# Patient Record
Sex: Female | Born: 1965 | Race: White | Hispanic: No | Marital: Married | Smoking: Never smoker
Health system: Southern US, Community
[De-identification: ages and names within clinical notes are randomized; demographics above are authoritative.]

## PROBLEM LIST (undated history)

## (undated) DIAGNOSIS — L739 Follicular disorder, unspecified: Secondary | ICD-10-CM

## (undated) DIAGNOSIS — R0989 Other specified symptoms and signs involving the circulatory and respiratory systems: Secondary | ICD-10-CM

## (undated) HISTORY — DX: Follicular disorder, unspecified: L73.9

## (undated) HISTORY — PX: ABDOMINAL HYSTERECTOMY: SHX81

## (undated) HISTORY — DX: Other specified symptoms and signs involving the circulatory and respiratory systems: R09.89

---

## 1999-10-19 ENCOUNTER — Emergency Department (HOSPITAL_COMMUNITY): Admission: EM | Admit: 1999-10-19 | Discharge: 1999-10-19 | Payer: Self-pay | Admitting: Emergency Medicine

## 2001-05-04 ENCOUNTER — Other Ambulatory Visit: Admission: RE | Admit: 2001-05-04 | Discharge: 2001-05-04 | Payer: Self-pay | Admitting: *Deleted

## 2002-05-29 ENCOUNTER — Other Ambulatory Visit: Admission: RE | Admit: 2002-05-29 | Discharge: 2002-05-29 | Payer: Self-pay | Admitting: *Deleted

## 2003-06-06 ENCOUNTER — Other Ambulatory Visit: Admission: RE | Admit: 2003-06-06 | Discharge: 2003-06-06 | Payer: Self-pay | Admitting: *Deleted

## 2004-06-25 ENCOUNTER — Other Ambulatory Visit: Admission: RE | Admit: 2004-06-25 | Discharge: 2004-06-25 | Payer: Self-pay | Admitting: *Deleted

## 2005-12-24 ENCOUNTER — Other Ambulatory Visit: Admission: RE | Admit: 2005-12-24 | Discharge: 2005-12-24 | Payer: Self-pay | Admitting: Obstetrics & Gynecology

## 2006-11-03 ENCOUNTER — Encounter: Admission: RE | Admit: 2006-11-03 | Discharge: 2006-11-03 | Payer: Self-pay | Admitting: Obstetrics & Gynecology

## 2006-11-10 ENCOUNTER — Encounter: Admission: RE | Admit: 2006-11-10 | Discharge: 2006-11-10 | Payer: Self-pay | Admitting: Obstetrics & Gynecology

## 2007-04-21 ENCOUNTER — Other Ambulatory Visit: Admission: RE | Admit: 2007-04-21 | Discharge: 2007-04-21 | Payer: Self-pay | Admitting: Obstetrics & Gynecology

## 2007-08-05 ENCOUNTER — Other Ambulatory Visit: Admission: RE | Admit: 2007-08-05 | Discharge: 2007-08-05 | Payer: Self-pay | Admitting: Obstetrics and Gynecology

## 2008-01-02 ENCOUNTER — Ambulatory Visit: Payer: Self-pay | Admitting: Obstetrics & Gynecology

## 2008-01-02 ENCOUNTER — Inpatient Hospital Stay (HOSPITAL_COMMUNITY): Admission: AD | Admit: 2008-01-02 | Discharge: 2008-01-04 | Payer: Self-pay | Admitting: Obstetrics & Gynecology

## 2008-01-06 ENCOUNTER — Ambulatory Visit: Payer: Self-pay | Admitting: Obstetrics & Gynecology

## 2008-01-06 ENCOUNTER — Inpatient Hospital Stay (HOSPITAL_COMMUNITY): Admission: AD | Admit: 2008-01-06 | Discharge: 2008-01-06 | Payer: Self-pay | Admitting: Obstetrics & Gynecology

## 2008-02-21 ENCOUNTER — Inpatient Hospital Stay (HOSPITAL_COMMUNITY): Admission: AD | Admit: 2008-02-21 | Discharge: 2008-02-21 | Payer: Self-pay | Admitting: Obstetrics & Gynecology

## 2008-02-21 ENCOUNTER — Ambulatory Visit: Payer: Self-pay | Admitting: Advanced Practice Midwife

## 2008-02-22 ENCOUNTER — Ambulatory Visit: Payer: Self-pay | Admitting: Family Medicine

## 2008-02-22 ENCOUNTER — Inpatient Hospital Stay (HOSPITAL_COMMUNITY): Admission: AD | Admit: 2008-02-22 | Discharge: 2008-02-24 | Payer: Self-pay | Admitting: Obstetrics & Gynecology

## 2009-11-26 ENCOUNTER — Other Ambulatory Visit: Admission: RE | Admit: 2009-11-26 | Discharge: 2009-11-26 | Payer: Self-pay | Admitting: Obstetrics & Gynecology

## 2010-07-27 ENCOUNTER — Encounter: Payer: Self-pay | Admitting: Obstetrics & Gynecology

## 2010-09-23 ENCOUNTER — Other Ambulatory Visit: Payer: Self-pay | Admitting: Obstetrics and Gynecology

## 2010-10-09 ENCOUNTER — Other Ambulatory Visit (HOSPITAL_COMMUNITY): Payer: Self-pay

## 2010-11-21 NOTE — Discharge Summary (Signed)
[name], Samantha [name]            ACCOUNT NO.:  [id number]   MEDICAL RECORD NO.:  [id number]          PATIENT TYPE:  INP   LOCATION:  9155                          FACILITY:  [facility]   PHYSICIAN:  Allie Bossier, MD        DATE OF BIRTH:  1965/07/08   DATE OF ADMISSION:  01/02/2008  DATE OF DISCHARGE:  01/04/2008                               DISCHARGE SUMMARY   ADMISSION DIAGNOSIS:  Possible premature rupture of membrane at 31-1/2  weeks' estimated gestational age.   DISCHARGE DIAGNOSIS:  Unlikely premature rupture of membranes at 31-1/2  weeks' estimated gestational age.   PROCEDURES DURING HER ADMISSION:  An ultrasound showing an AFI of 20 cm,  vertex singleton with an estimated fetal weight at the 90th percentile,  normal white blood cell count, normal urinalysis, and negative cervical  cultures.  She also received betamethasone x2 24 hours apart.   DISPOSITION:  Home.   CONDITION:  Stable.   ACTIVITY:  Pelvic rest.   Follow up tomorrow at Dr. Rayna Sexton office or sooner as necessary.   HISTORY OF PRESENT ILLNESS AND HOSPITAL COURSE:  [name] is a 45-  year-old woman.  She is a gravida 3, para 2, at 31-1/2 weeks' estimated  gestational age.  She came in with a history consistent with rupture of  membranes.  She denied contractions.  Her AFI was 20 and her exam showed  positive Nitrazine and a question of positive ferning.  She was admitted  and given betamethasone.  After completion of the steroid course, a  repeat sterile speculum exam showed positive Nitrazine, but negative  fern, negative pull, negative Valsalva and her cervix appeared visually  closed.  She was discharged home in stable condition and we will follow  up tomorrow or p.r.n. sooner.  All questions were answered.      Allie Bossier, MD  Electronically Signed     MCD/MEDQ  D:  01/26/2008  T:  01/27/2008  Job:  [id number]

## 2011-01-01 ENCOUNTER — Encounter (HOSPITAL_COMMUNITY)
Admission: RE | Admit: 2011-01-01 | Discharge: 2011-01-01 | Disposition: A | Discharge status: Home / Self Care | Payer: BC Managed Care – PPO | Source: Ambulatory Visit | Attending: Obstetrics and Gynecology | Admitting: Obstetrics and Gynecology

## 2011-01-01 LAB — CBC
HCT: 40.9 % (ref 36.0–46.0)
Hemoglobin: 13.6 g/dL (ref 12.0–15.0)
MCHC: 33.3 g/dL (ref 30.0–36.0)
MCV: 88.3 fL (ref 78.0–100.0)
WBC: 5.4 10*3/uL (ref 4.0–10.5)

## 2011-01-01 LAB — COMPREHENSIVE METABOLIC PANEL
Alkaline Phosphatase: 72 U/L (ref 39–117)
BUN: 11 mg/dL (ref 6–23)
Chloride: 104 mEq/L (ref 96–112)
GFR calc Af Amer: >60 mL/min (ref >60)
Glucose, Bld: 94 mg/dL (ref 70–99)
Potassium: 4.5 mEq/L (ref 3.5–5.1)
Total Bilirubin: 0.8 mg/dL (ref 0.3–1.2)

## 2011-01-01 LAB — URINALYSIS, ROUTINE W REFLEX MICROSCOPIC
Glucose, UA: NEGATIVE mg/dL
Hgb urine dipstick: NEGATIVE
Ketones, ur: NEGATIVE mg/dL
Leukocytes, UA: NEGATIVE
pH: 6.5 (ref 5.0–8.0)

## 2011-01-01 LAB — HCG, QUANTITATIVE, PREGNANCY: hCG, Beta Chain, Quant, S: 1 m[IU]/mL (ref <5)

## 2011-01-04 NOTE — H&P (Signed)
  [name], [name] NO.:  [id number]  MEDICAL RECORD NO.:  [id number]  LOCATION:  DAY                           FACILITY:  [facility]  PHYSICIAN:  Tilda Burrow, M.D. DATE OF BIRTH:  1965-09-21  DATE OF ADMISSION:  01/06/2011 DATE OF DISCHARGE:  LH                             HISTORY & PHYSICAL   ADMISSION DIAGNOSES:  Second-degree uterine descensus, small rectocele, small cystocele scheduled for admission to [facility] for January 06, 2011, surgery.  HISTORY OF PRESENT ILLNESS:  This 45 year old female gravida 3, para 3-0- 0-3, last menstrual period mid June, has been evaluated at [facility] after being followed for second-degree uterine descensus for a couple years.  Symptoms were noted shortly after her last child in 2008. There has been progression of descent of the cervix to the point that she now feels the cervix with wiping.  There is some pain with intercourse secondary to uterine contact.  She has had endometrial biopsy which shows benign proliferative endometrium.  The Pap smears are normal and endometrial biopsy normal.  Procedure has been reviewed with the patient with technical aspects of the procedure reviewed using [employer] instructional booklet.  Alternative treatments including continued observation have been reviewed and declined by the patient.  PAST MEDICAL HISTORY:  Essentially, benign.  OBSTETRICAL HISTORY:  Vaginal deliveries x3.  SURGICAL HISTORY:  Otherwise, negative.  ALLERGIES:  None.  PHYSICAL EXAMINATION:  VITAL SIGNS:  Height 5 feet 6 inches, weight 204.2, blood pressure 178.  Urinalysis negative. GENERAL:  Healthy Caucasian female, appropriate, orientation x3.  Pupils equal, round and reactive. NECK:  Supple. CHEST:  Clear to auscultation. ABDOMEN:  Obese without masses. EXTERNAL GENITALIA:  Cervix visible 2-cm of the introitus.  The uterus is midplane, normal size, shape and contour with cervix quite  elongate. There is moderate posterior laxity.  Anterior support seems reasonable. The patient has minimal stress incontinence symptoms using only a pad with severe cough or cold.  Additional symptoms include bowel movements and slow infrequent up to a week apart due to slow bowel function.  Use of MiraLax has been recently instituted.  The constipation was thought to be made worse by the developing rectocele.  PLAN:  Vaginal hysterectomy, posterior repair, consideration of small anterior repair to be performed on January 06, 2011, at [facility].     Tilda Burrow, M.D.     JVF/MEDQ  D:  12/26/2010  T:  12/27/2010  Job:  [id number]  cc:   [facility]  Electronically Signed by Christin Bach M.D. on 01/04/2011 10:29:31 PM

## 2011-01-06 ENCOUNTER — Inpatient Hospital Stay (HOSPITAL_COMMUNITY)
Admission: RE | Admit: 2011-01-06 | Discharge: 2011-01-07 | DRG: 359 | Disposition: A | Discharge status: Home / Self Care | Payer: BC Managed Care – PPO | Source: Ambulatory Visit | Attending: Obstetrics and Gynecology | Admitting: Obstetrics and Gynecology

## 2011-01-06 ENCOUNTER — Other Ambulatory Visit: Payer: Self-pay | Admitting: Obstetrics and Gynecology

## 2011-01-06 DIAGNOSIS — N814 Uterovaginal prolapse, unspecified: Principal | ICD-10-CM | POA: Diagnosis present

## 2011-01-07 LAB — CBC
HCT: 33.8 % — ABNORMAL LOW (ref 36.0–46.0)
Hemoglobin: 11.1 g/dL — ABNORMAL LOW (ref 12.0–15.0)
MCH: 29.5 pg (ref 26.0–34.0)
MCHC: 32.8 g/dL (ref 30.0–36.0)

## 2011-01-07 LAB — BASIC METABOLIC PANEL
BUN: 8 mg/dL (ref 6–23)
GFR calc non Af Amer: >60 mL/min (ref >60)
Glucose, Bld: 138 mg/dL — ABNORMAL HIGH (ref 70–99)
Potassium: 4.2 mEq/L (ref 3.5–5.1)

## 2011-01-07 LAB — DIFFERENTIAL
Lymphocytes Relative: 16 % (ref 12–46)
Lymphs Abs: 2.1 10*3/uL (ref 0.7–4.0)
Monocytes Absolute: 0.9 10*3/uL (ref 0.1–1.0)
Monocytes Relative: 7 % (ref 3–12)
Neutro Abs: 9.5 10*3/uL — ABNORMAL HIGH (ref 1.7–7.7)

## 2011-01-14 NOTE — Discharge Summary (Signed)
  Samantha [name], HAYMAN NO.:  [id number]  MEDICAL RECORD NO.:  [id number]  LOCATION:  A306                          FACILITY:  [facility]  PHYSICIAN:  Tilda Burrow, M.D. DATE OF BIRTH:  June 27, 1966  DATE OF ADMISSION:  01/06/2011 DATE OF DISCHARGE:  07/04/2012LH                              DISCHARGE SUMMARY   ADMITTING DIAGNOSES:  Second-degree uterine descensus, small rectocele, cystocele.  DISCHARGE DIAGNOSES:  Second-degree uterine descensus, small rectocele, cystocele.  PROCEDURE:  Vaginal hysterectomy, anterior-posterior repair.  DISCHARGE MEDICATIONS: 1. MiraLax 17 grams p.o. each morning and p.r.n. 2. Percocet 5/325, 1 n.p.o. q.6 h. p.r.n. pain. 3. Toradol 10 mg p.o. q.6 h. p.r.n. pain.  FOLLOWUP:  One week [facility] with routine postsurgical instructions including notifying us for fever, temperature, or sense of malaise, no driving x1 week.  HOSPITAL SUMMARY:  This 45 year old gravida 3, para 3 just finishing her last menses in mid June was admitted for vaginal hysterectomy. Admitting labs included hemoglobin 13, hematocrit 40, white count 5400, platelets 313,000, potassium 4.5, BUN 11, creatinine 0.96.  Urinalysis negative.  Blood type is O+.  Surgery was performed July 3 with uncomplicated procedure.  Postop hemoglobin was 11.1, hematocrit 33.5, white count 12,500 considered surgical stress, neutrophils are 76% within normal limits.  Potassium is 4.2, BUN 8, creatinine 0.91.  She was afebrile on postop day #1 with T-max overnight at 99.5, 99.7, pulse in the 60s, blood pressure 92/55 with oxygen saturations in the 90s on room air.  She was tolerating regular diet with abdomen soft, legs nontender.  She was discharged for routine outpatient care.  Follow up in 7-10 days at [facility].     Tilda Burrow, M.D.     JVF/MEDQ  D:  01/07/2011  T:  01/07/2011  Job:  [id number]  cc:   [facility]  Electronically Signed by  Christin Bach M.D. on 01/14/2011 02:40:19 PM

## 2011-01-14 NOTE — Op Note (Signed)
Samantha [name], SARKISYAN NO.:  [id number]  MEDICAL RECORD NO.:  [id number]  LOCATION:  A306                          FACILITY:  [facility]  PHYSICIAN:  Tilda Burrow, M.D. DATE OF BIRTH:  [birth year]  DATE OF PROCEDURE: DATE OF DISCHARGE:                              OPERATIVE REPORT   PREOPERATIVE DIAGNOSES:  Second-degree uterine descensus cystocele, rectocele.  POSTOPERATIVE DIAGNOSES:  Second-degree uterine descensus cystocele, rectocele.  PROCEDURE:  Vaginal hysterectomy, anterior and posterior repair.  SURGEON:  Tilda Burrow, MD  ASSISTANTMarlinda Mike, RNFA  COMPLICATIONS:  None.  FINDINGS:  A 200 grams uterus with cervix protruding past the introitus in the anesthetized state, elongated cervix.  Very vascular tissues particularly in the posterior repair area, normal-appearing ovaries bilaterally.  DETAILS OF PROCEDURE:  The patient was taken to the operating room, prepped and draped.  Time-out conducted, procedure confirmed by involved parties.  Ancef administered preoperatively.  The patient had a bowel prep the night before with last bowel movement 9:00 p.m. the night before.  A circumferential incision was made around the cervix just below the perceived intersection of the cervix to the vagina from 9 o'clock to 3 o'clock and the vesicouterine cleavage plane identified and able to be dissected quite easily up and the vesicouterine reflection of peritoneum identified and opened.  There was some clear fluid expressed at this time in order to be sure that it was peritoneal fluid, Foley catheter was inserted at this point in the case and confirmed that the bladder deep still was quite full of clear urine even when the catheter bag was well above the entry site of the vesicouterine reflection of peritoneum.  Posterior colpotomy was performed in standard fashion easily identifying the cul-de-sac.  Uterosacral ligaments were clamped, cut, and  suture ligated on each side.  These were tagged with 0 chromic suture for future identification.  Lower and upper cardinal ligaments were serially clamped, cut, and suture ligated being careful to stay very close to the cervix, take small bites, and march up each side.  Once the upper cardinal ligaments were reached, we were able to incorporate the anterior vesicouterine peritoneum into each pedicle.  Marching up the broad ligament on either side with similar bites with Zeppelin clamps, Mayo scissor transection and 0 chromic suture ligature continued the procedure.  Before reaching the utero-ovarian ligaments, we amputated off the lower uterine segment and cervix for improved visibility.  The remainder of the pedicles were clamped, cut, and suture ligated in similar fashion while rotating the uterus side-to-side to optimize visibility.  Two moistened laparotomy tapes had been placed beneath the bowel to elevate the bowel out of the surgical field.  The utero-ovarian ligaments and round ligament, fallopian tube proximal portion were all clamped, cut, suture ligated in pedicles that were inspected and tagged for future for confirmation of hemostasis.  Uterus was removed and pedicles inspected.  Both sides required superficial suturing at just anterior to the tip of the uterosacral ligament first bite, at the level of the lower cardinal ligament.  This was performed while being very careful to stay superficial.  Hemostasis at this time was deemed satisfactory.  Posterior cul-de-sac incision, the initial  colpotomy incision had extensive varicosities and it required an additional suture just caudad to the left uterosacral ligament pedicle.  This gave [place] adequate control to proceed.  Anterior peritoneum could be identified and was attached to the running transverse fashion to the posterior cul-de-sac above the cuff itself.  Anterior repair was minimal and consisted of trimming off  approximately 1 to 1.5 cm wide ellipse of skin from the anterior vaginal mucosa to reduce redundancy of the anterior wall and then the remaining vaginal cuff edges were attached to the posterior cuff edges with a series of interrupted 0 chromic sutures with good hemostasis and tissue support achieved.  Posterior repair.  Posterior repair began with a transverse incision at the level of hymen remnants going from 4 o'clock to 8 o'clock and then dissecting the vaginal mucosa upward on the posterior wall of the vagina approximately 4 cm.  The underlying tissues were sharply dissected off the vaginal epithelium which was split in the midline for the similar depth.  Bulbocavernosus muscles could be identified on either side and easily pulled into the midline with horizontal mattress sutures of 0 Vicryl.  At no time was the rectum considered at risk during this portion of the procedure.  The perineal body was reinforced further with 2 additional sutures and a second layer reinforced the perineal body. The slight redundancy of vaginal epithelium was then trimmed as part of the specimen and then the vaginal epithelium pulled together with 2-0 chromic.  The patient tolerated the procedure well.  Vaginal packing was then placed with Betadine soap and Vaseline covered gauze and the patient went to recovery room in good condition.  Sponge and needle counts correct throughout the case.  EBL 150-200 mL.     Tilda Burrow, M.D.     JVF/MEDQ  D:  01/06/2011  T:  01/07/2011  Job:  [id number]  cc:   [facility]  Electronically Signed by Christin Bach M.D. on 01/14/2011 02:40:30 PM

## 2011-04-02 LAB — URINALYSIS, ROUTINE W REFLEX MICROSCOPIC
Bilirubin Urine: NEGATIVE
Ketones, ur: 15 — AB
Nitrite: NEGATIVE
Protein, ur: NEGATIVE
Urobilinogen, UA: 0.2

## 2011-04-02 LAB — CBC
HCT: 32.3 — ABNORMAL LOW
Platelets: 237
WBC: 9.5

## 2011-04-02 LAB — URINE CULTURE: Special Requests: NEGATIVE

## 2011-04-02 LAB — GC/CHLAMYDIA PROBE AMP, URINE: Chlamydia, Swab/Urine, PCR: NEGATIVE

## 2011-04-02 LAB — DIFFERENTIAL
Eosinophils Relative: 2
Lymphocytes Relative: 17
Lymphs Abs: 1.6
Neutro Abs: 7
Neutrophils Relative %: 74

## 2011-04-02 LAB — STREP B DNA PROBE: Strep Group B Ag: NEGATIVE

## 2012-07-05 ENCOUNTER — Other Ambulatory Visit: Payer: Self-pay | Admitting: Adult Health

## 2012-07-05 DIAGNOSIS — E041 Nontoxic single thyroid nodule: Secondary | ICD-10-CM

## 2012-07-07 ENCOUNTER — Ambulatory Visit (HOSPITAL_COMMUNITY)
Admission: RE | Admit: 2012-07-07 | Discharge: 2012-07-07 | Disposition: A | Discharge status: Home / Self Care | Payer: BC Managed Care – PPO | Source: Ambulatory Visit | Attending: Adult Health | Admitting: Adult Health

## 2012-07-07 DIAGNOSIS — E041 Nontoxic single thyroid nodule: Secondary | ICD-10-CM

## 2013-10-10 ENCOUNTER — Other Ambulatory Visit: Payer: Self-pay | Admitting: Adult Health

## 2013-12-28 ENCOUNTER — Ambulatory Visit (INDEPENDENT_AMBULATORY_CARE_PROVIDER_SITE_OTHER): Payer: BC Managed Care – PPO | Admitting: Adult Health

## 2013-12-28 ENCOUNTER — Other Ambulatory Visit: Payer: Self-pay | Admitting: Adult Health

## 2013-12-28 ENCOUNTER — Encounter: Payer: Self-pay | Admitting: Adult Health

## 2013-12-28 VITALS — BP 110/76 | HR 64 | Ht 63.0 in | Wt 204.5 lb

## 2013-12-28 DIAGNOSIS — Z1231 Encounter for screening mammogram for malignant neoplasm of breast: Secondary | ICD-10-CM

## 2013-12-28 DIAGNOSIS — Z1212 Encounter for screening for malignant neoplasm of rectum: Secondary | ICD-10-CM

## 2013-12-28 DIAGNOSIS — Z01419 Encounter for gynecological examination (general) (routine) without abnormal findings: Secondary | ICD-10-CM

## 2013-12-28 DIAGNOSIS — L739 Follicular disorder, unspecified: Secondary | ICD-10-CM

## 2013-12-28 HISTORY — DX: Follicular disorder, unspecified: L73.9

## 2013-12-28 LAB — HEMOCCULT GUIAC POC 1CARD (OFFICE): FECAL OCCULT BLD: NEGATIVE

## 2013-12-28 MED ORDER — SULFAMETHOXAZOLE-TMP DS 800-160 MG PO TABS: 1.0000 | ORAL_TABLET | Freq: Two times a day (BID) | ORAL | Status: DC

## 2013-12-28 NOTE — Patient Instructions (Signed)
Physical in  1 year [facility] Take septra ds Folliculitis  Folliculitis is redness, soreness, and swelling (inflammation) of the hair follicles. This condition can occur anywhere on the body. People with weakened immune systems, diabetes, or obesity have a greater risk of getting folliculitis. CAUSES  Bacterial infection. This is the most common cause.  Fungal infection.  Viral infection.  Contact with certain chemicals, especially oils and tars. Long-term folliculitis can result from bacteria that live in the nostrils. The bacteria may trigger multiple outbreaks of folliculitis over time. SYMPTOMS Folliculitis most commonly occurs on the scalp, thighs, legs, back, buttocks, and areas where hair is shaved frequently. An early sign of folliculitis is a small, white or yellow, pus-filled, itchy lesion (pustule). These lesions appear on a red, inflamed follicle. They are usually less than 0.2 inches (5 mm) wide. When there is an infection of the follicle that goes deeper, it becomes a boil or furuncle. A group of closely packed boils creates a larger lesion (carbuncle). Carbuncles tend to occur in hairy, sweaty areas of the body. DIAGNOSIS  Your caregiver can usually tell what is wrong by doing a physical exam. A sample may be taken from one of the lesions and tested in a lab. This can help determine what is causing your folliculitis. TREATMENT  Treatment may include:  Applying warm compresses to the affected areas.  Taking antibiotic medicines orally or applying them to the skin.  Draining the lesions if they contain a large amount of pus or fluid.  Laser hair removal for cases of long-lasting folliculitis. This helps to prevent regrowth of the hair. HOME CARE INSTRUCTIONS  Apply warm compresses to the affected areas as directed by your caregiver.  If antibiotics are prescribed, take them as directed. Finish them even if you start to feel better.  You may  take over-the-counter medicines to relieve itching.  Do not shave irritated skin.  Follow up with your caregiver as directed. SEEK IMMEDIATE MEDICAL CARE IF:   You have increasing redness, swelling, or pain in the affected area.  You have a fever. MAKE SURE YOU:  Understand these instructions.  Will watch your condition.  Will get help right away if you are not doing well or get worse. Document Released: 08/31/2001 Document Revised: 12/22/2011 Document Reviewed: 09/22/2011 [facility] Patient Information 2015 [employer], [place]. This information is not intended to replace advice given to you by your health care provider. Make sure you discuss any questions you have with your health care provider.

## 2013-12-28 NOTE — Progress Notes (Signed)
Patient ID: Samantha [name], female   DOB: 02/01/1966, 48 y.o.   MRN: [id number] History of Present Illness:  Samantha [name] is a 48 year old white female,married on for a physical.She complains of ingrown hair on mons pubis.  Current Medications, Allergies, Past Medical History, Past Surgical History, Family History and Social History were reviewed in [facility] electronic medical record.     Review of Systems: Patient denies any headaches, blurred vision, shortness of breath, chest pain, abdominal pain, problems with bowel movements, urination, or intercourse. Not currently having sex, husband had bladder cancer, and is anemic and had kidney problems now.No joint pain or mood swings.Kids are good.    Physical Exam:BP 110/76  Pulse 64  Ht 5\' 3"  (1.6 m)  Wt 204 lb 8 oz (92.761 kg)  BMI 36.23 kg/m2 General:  Well developed, well nourished, no acute distress Skin:  Warm and dry Neck:  Midline trachea,  Thyroid enlarged but had US in 2014 that was OK Lungs; Clear to auscultation bilaterally Breast:  No dominant palpable mass, retraction, or nipple discharge Cardiovascular: Regular rate and rhythm Abdomen:  Soft, non tender, no hepatosplenomegaly Pelvic:  External genitalia is normal in appearance, except has folliculitis in 2 spots on mons pubis.  The vagina has good color, moisture and rugae, the cervix and uterus are absent.  No adnexal masses or tenderness noted. Rectal: Good sphincter tone, no polyps, or hemorrhoids felt.  Hemoccult negative. Extremities:  No swelling or varicosities noted Psych:  No mood changes, alert and cooperative,seems happy   Impression: Yearly gyn exam no pap Folliculitis     Plan: Rx Septra ds 1 bid x 14 days with 1 refill  Don't shave [facility] Review handout on folliculitis Physical in 1 year Declines labs Colonoscopy at 51

## 2014-01-01 ENCOUNTER — Ambulatory Visit (INDEPENDENT_AMBULATORY_CARE_PROVIDER_SITE_OTHER): Payer: BC Managed Care – PPO | Admitting: Emergency Medicine

## 2014-01-01 ENCOUNTER — Ambulatory Visit (INDEPENDENT_AMBULATORY_CARE_PROVIDER_SITE_OTHER): Payer: BC Managed Care – PPO

## 2014-01-01 VITALS — BP 108/86 | HR 82 | Temp 98.6°F | Resp 20 | Ht 63.5 in | Wt 206.4 lb

## 2014-01-01 DIAGNOSIS — S93499A Sprain of other ligament of unspecified ankle, initial encounter: Secondary | ICD-10-CM

## 2014-01-01 DIAGNOSIS — S96819A Strain of other specified muscles and tendons at ankle and foot level, unspecified foot, initial encounter: Secondary | ICD-10-CM

## 2014-01-01 DIAGNOSIS — S86012A Strain of left Achilles tendon, initial encounter: Secondary | ICD-10-CM

## 2014-01-01 MED ORDER — HYDROCODONE-ACETAMINOPHEN 5-325 MG PO TABS: 1.0000 | ORAL_TABLET | ORAL | Status: DC | PRN

## 2014-01-01 NOTE — Patient Instructions (Signed)
Complete Achilles Tendon Rupture An Achilles tendon rupture is an injury in which the tough, cord-like band that attaches the lower muscles of your leg to your heel (Achilles tendon) tears (ruptures). In a complete Achilles tendon rupture, you are not able to stand up on the toes of the injured leg. CAUSES A tendon may rupture if it is weakened or weakening (degenerative) and a sudden stress is applied to it. Weakening or degeneration of a tendon may be caused by:  Recurrent injuries, such as those causing Achilles tendinitis.  Damaged tendons.  Aging.  Vascular disease of the tendon. SIGNS AND SYMPTOMS  Feeling as if you were struck violently in the back of the ankle.  Hearing a "pop" and experiencing severe, sudden (acute) pain (however, the absence of pain does not mean there was not a rupture). DIAGNOSIS A physical exam is usually all that is needed to diagnose an Achilles tendon rupture. During the exam, your health care provider will touch the tendon and the structures around it. You may be asked to lie on your stomach or kneel on a chair while your health care provider squeezes your calf muscle. You most likely have a ruptured tendon if your foot does not flex.  Sometimes tests are performed. These may include:  An ultrasound. This allows quick confirmation of the diagnosis.  An X-ray.  An MRI. TREATMENT  A complete Achilles tendon rupture is treated with surgery. You will have a cast while the injury heals (this usually takes 6-10 weeks). Before your surgery, treatment may consist of:  Ice applied to the area.  Pain-relieving medicines.  Rest.  Crutches.  Keeping the ankle from moving (immobilization ), usually with a splint. HOME CARE INSTRUCTIONS   Apply ice to the injured area:   Put ice in a plastic bag.   Place a towel between your skin and the bag.   Leave the ice on for 20 minutes, 2-3 times a day.   Use crutches and move about only as instructed.    Keep the leg elevated above the level of the heart (the center of the chest) at all times when not using the bathroom. Do not dangle the leg over a chair, couch, or bed. When lying down, elevate your leg on a few pillows. Elevation prevents swelling and reduces pain.   Avoid use of the injured area other than gentle range of motion of the toes.   Do not drive a car until your health care provider specifically tells you it is safe to do so.   Only take over-the-counter or prescription medicines for pain, discomfort, or fever as directed by your health care provider.  Keep all follow-up appointments with your health care provider. SEEK MEDICAL CARE IF:   Your pain and swelling increase, or your pain is not controlled by medicines.   You have new, unexplained symptoms, or your symptoms get worse.   You cannot move your toes or foot.  You develop warmth and swelling in your foot.  You have an unexplained fever. MAKE SURE YOU:   Understand these instructions.  Will watch your condition.  Will get help right away if you are not doing well or get worse. Document Released: 04/01/2005 Document Revised: 04/12/2013 Document Reviewed: 02/10/2013 [facility] Patient Information 2015 [employer], [place]LL[place]. This information is not intended to replace advice given to you by your health care provider. Make sure you discuss any questions you have with your health care provider.

## 2014-01-01 NOTE — Progress Notes (Signed)
[facility] [address], [place] [address] [phone]  Date:  01/01/2014   Name:  Samantha [name]   DOB:  09/09/1965   MRN:  [id number]  PCP:  Cyril MourningGRIFFIN,JENNIFER, NP    Chief Complaint: Foot Pain   History of Present Illness:  Samantha [name] is a 48 y.o. very pleasant female patient who presents with the following:  Running to second base in softball game Friday and she had pain in heel and collapsed.  Marked swelling and ecchymosis ankle with inability to walk without support.  No improvement with over the counter medications or other home remedies. Denies other complaint or health concern today.   Patient Active Problem List   Diagnosis Date Noted  . Folliculitis 12/28/2013    Past Medical History  Diagnosis Date  . Folliculitis 12/28/2013    Past Surgical History  Procedure Laterality Date  . Abdominal hysterectomy      History  Substance Use Topics  . Smoking status: Never Smoker   . Smokeless tobacco: Never Used  . Alcohol Use: No    Family History  Problem Relation Age of Onset  . Other Mother     knee pain  . Other Father     brain tumor  . Other Sister     panic attacks    No Known Allergies  Medication list has been reviewed and updated.  Current Outpatient Prescriptions on File Prior to Visit  Medication Sig Dispense Refill  . penicillin v potassium (VEETID) 500 MG tablet       . sulfamethoxazole-trimethoprim (BACTRIM DS) 800-160 MG per tablet Take 1 tablet by mouth 2 (two) times daily.  28 tablet  1   No current facility-administered medications on file prior to visit.    Review of Systems:  As per HPI, otherwise negative.    Physical Examination: Filed Vitals:   01/01/14 0904  BP: 108/86  Pulse: 82  Temp: 98.6 F (37 C)  Resp: 20   Filed Vitals:   01/01/14 0904  Height: 5' 3.5" (1.613 m)  Weight: 206 lb 6.4 oz (93.622 kg)   Body mass index is 35.98 kg/(m^2). Ideal Body Weight: Weight in (lb) to  have BMI = 25: 143.1   GEN: WDWN, NAD, Non-toxic, Alert & Oriented x 3 HEENT: Atraumatic, Normocephalic.  Ears and Nose: No external deformity. EXTR: No clubbing/cyanosis/edema NEURO: Normal gait.  PSYCH: Normally interactive. Conversant. Not depressed or anxious appearing.  Calm demeanor.  Left Ankle:  Ecchymotic and swollen medial ankle.  Unable to plantar flex  Assessment and Plan: Achilles tendon rupture vicodin [facility]  Signed,  Phillips OdorJeffery Anderson, MD   [facility] reading (PRIMARY) by  Dr. Dareen PianoAnderson.  negative.

## 2014-01-02 ENCOUNTER — Ambulatory Visit (HOSPITAL_COMMUNITY): Payer: BC Managed Care – PPO

## 2014-08-08 ENCOUNTER — Ambulatory Visit (HOSPITAL_COMMUNITY)
Admission: RE | Admit: 2014-08-08 | Discharge: 2014-08-08 | Disposition: A | Discharge status: Home / Self Care | Payer: BC Managed Care – PPO | Source: Ambulatory Visit | Attending: Adult Health | Admitting: Adult Health

## 2014-08-08 DIAGNOSIS — Z1231 Encounter for screening mammogram for malignant neoplasm of breast: Secondary | ICD-10-CM

## 2014-08-09 ENCOUNTER — Other Ambulatory Visit: Payer: Self-pay | Admitting: Adult Health

## 2014-08-09 DIAGNOSIS — R928 Other abnormal and inconclusive findings on diagnostic imaging of breast: Secondary | ICD-10-CM

## 2014-08-17 ENCOUNTER — Ambulatory Visit
Admission: RE | Admit: 2014-08-17 | Discharge: 2014-08-17 | Disposition: A | Discharge status: Home / Self Care | Payer: BC Managed Care – PPO | Source: Ambulatory Visit | Attending: Adult Health | Admitting: Adult Health

## 2014-08-17 DIAGNOSIS — R928 Other abnormal and inconclusive findings on diagnostic imaging of breast: Secondary | ICD-10-CM

## 2015-01-08 ENCOUNTER — Ambulatory Visit (INDEPENDENT_AMBULATORY_CARE_PROVIDER_SITE_OTHER): Payer: BC Managed Care – PPO | Admitting: Adult Health

## 2015-01-08 ENCOUNTER — Encounter: Payer: Self-pay | Admitting: Adult Health

## 2015-01-08 VITALS — BP 108/72 | HR 72 | Ht 63.0 in | Wt 213.5 lb

## 2015-01-08 DIAGNOSIS — Z01419 Encounter for gynecological examination (general) (routine) without abnormal findings: Secondary | ICD-10-CM

## 2015-01-08 DIAGNOSIS — Z1212 Encounter for screening for malignant neoplasm of rectum: Secondary | ICD-10-CM

## 2015-01-08 DIAGNOSIS — R0989 Other specified symptoms and signs involving the circulatory and respiratory systems: Secondary | ICD-10-CM

## 2015-01-08 HISTORY — DX: Other specified symptoms and signs involving the circulatory and respiratory systems: R09.89

## 2015-01-08 LAB — HEMOCCULT GUIAC POC 1CARD (OFFICE): Fecal Occult Blood, POC: NEGATIVE

## 2015-01-08 NOTE — Progress Notes (Signed)
Patient ID: Samantha [name] [name], female   DOB: 08/04/1965, 49 y.o.   MRN: [id number] History of Present Illness: Samantha ReasonerRoxanne is a 49 year old white female in for well woman gyn exam and complains of choking sensation.IS not made worse with eating, no heartburn.She is sp hysterectomy.    Current Medications, Allergies, Past Medical History, Past Surgical History, Family History and Social History were reviewed in [facility] electronic medical record.     Review of Systems: Patient denies any headaches, hearing loss, fatigue, blurred vision, shortness of breath, chest pain, abdominal pain, problems with bowel movements, urination, or intercourse. No joint pain or mood swings.See HPI for positives    Physical Exam:BP 108/72 mmHg  Pulse 72  Ht 5\' 3"  (1.6 m)  Wt 213 lb 8 oz (96.843 kg)  BMI 37.83 kg/m2 General:  Well developed, well nourished, no acute distress Skin:  Warm and dry,tan has freckles Neck:  Midline trachea, normal thyroid, good ROM, no lymphadenopathy Lungs; Clear to auscultation bilaterally Breast:  No dominant palpable mass, retraction, or nipple discharge Cardiovascular: Regular rate and rhythm Abdomen:  Soft, non tender, no hepatosplenomegaly Pelvic:  External genitalia is normal in appearance, no lesions.  The vagina is normal in appearance. Urethra has no lesions or masses. The cervix and uterus are absent. No adnexal masses or tenderness noted.Bladder is non tender, no masses felt. Rectal: Good sphincter tone, no polyps, or hemorrhoids felt.  Hemoccult negative. Extremities/musculoskeletal:  No swelling or varicosities noted, no clubbing or cyanosis Psych:  No mood changes, alert and cooperative,seems happy   Impression: Well woman gyn exam no pap Choking sensation    Plan: Check CBC,CMP,TSH [facility], if negative will refer to GI Physical in 1 year Mammogram yearly Colonoscopy at 51

## 2015-01-08 NOTE — Patient Instructions (Signed)
Physical in 1 year Mammogram yearly Colonoscopy at 5350 will talk when labs back Thyroid US 7/12 at 11 am [facility]

## 2015-01-09 ENCOUNTER — Telehealth: Payer: Self-pay | Admitting: Adult Health

## 2015-01-09 LAB — COMPREHENSIVE METABOLIC PANEL
ALT: 7 IU/L (ref 0–32)
AST: 12 IU/L (ref 0–40)
Albumin/Globulin Ratio: 1.9 (ref 1.1–2.5)
Albumin: 4.6 g/dL (ref 3.5–5.5)
Alkaline Phosphatase: 71 IU/L (ref 39–117)
BILIRUBIN TOTAL: 0.5 mg/dL (ref 0.0–1.2)
BUN/Creatinine Ratio: 9 (ref 9–23)
BUN: 8 mg/dL (ref 6–24)
CALCIUM: 9.2 mg/dL (ref 8.7–10.2)
CO2: 22 mmol/L (ref 18–29)
CREATININE: 0.9 mg/dL (ref 0.57–1.00)
Chloride: 101 mmol/L (ref 97–108)
GFR, EST AFRICAN AMERICAN: 88 mL/min/{1.73_m2} (ref >59)
GFR, EST NON AFRICAN AMERICAN: 76 mL/min/{1.73_m2} (ref >59)
GLOBULIN, TOTAL: 2.4 g/dL (ref 1.5–4.5)
Glucose: 90 mg/dL (ref 65–99)
Potassium: 4.2 mmol/L (ref 3.5–5.2)
SODIUM: 140 mmol/L (ref 134–144)
Total Protein: 7 g/dL (ref 6.0–8.5)

## 2015-01-09 LAB — CBC
HEMATOCRIT: 39.5 % (ref 34.0–46.6)
HEMOGLOBIN: 13.3 g/dL (ref 11.1–15.9)
MCH: 29.6 pg (ref 26.6–33.0)
MCHC: 33.7 g/dL (ref 31.5–35.7)
MCV: 88 fL (ref 79–97)
Platelets: 296 10*3/uL (ref 150–379)
RBC: 4.5 x10E6/uL (ref 3.77–5.28)
RDW: 13.6 % (ref 12.3–15.4)
WBC: 8.1 10*3/uL (ref 3.4–10.8)

## 2015-01-09 LAB — TSH: TSH: 1.56 u[IU]/mL (ref 0.450–4.500)

## 2015-01-09 NOTE — Telephone Encounter (Signed)
Pt aware labs normal  

## 2015-01-15 ENCOUNTER — Ambulatory Visit (HOSPITAL_COMMUNITY): Admission: RE | Admit: 2015-01-15 | Payer: BC Managed Care – PPO | Source: Ambulatory Visit

## 2015-12-17 ENCOUNTER — Other Ambulatory Visit: Payer: Self-pay | Admitting: Obstetrics and Gynecology

## 2015-12-17 DIAGNOSIS — Z1231 Encounter for screening mammogram for malignant neoplasm of breast: Secondary | ICD-10-CM

## 2015-12-25 ENCOUNTER — Ambulatory Visit
Admission: RE | Admit: 2015-12-25 | Discharge: 2015-12-25 | Disposition: A | Discharge status: Home / Self Care | Payer: BC Managed Care – PPO | Source: Ambulatory Visit | Attending: Obstetrics and Gynecology | Admitting: Obstetrics and Gynecology

## 2015-12-25 DIAGNOSIS — Z1231 Encounter for screening mammogram for malignant neoplasm of breast: Secondary | ICD-10-CM

## 2015-12-27 ENCOUNTER — Other Ambulatory Visit: Payer: Self-pay | Admitting: Obstetrics and Gynecology

## 2015-12-27 DIAGNOSIS — R928 Other abnormal and inconclusive findings on diagnostic imaging of breast: Secondary | ICD-10-CM

## 2016-01-03 ENCOUNTER — Ambulatory Visit
Admission: RE | Admit: 2016-01-03 | Discharge: 2016-01-03 | Disposition: A | Discharge status: Home / Self Care | Payer: BC Managed Care – PPO | Source: Ambulatory Visit | Attending: Obstetrics and Gynecology | Admitting: Obstetrics and Gynecology

## 2016-01-03 DIAGNOSIS — R928 Other abnormal and inconclusive findings on diagnostic imaging of breast: Secondary | ICD-10-CM

## 2016-05-26 ENCOUNTER — Other Ambulatory Visit: Payer: BC Managed Care – PPO | Admitting: Adult Health

## 2016-07-16 ENCOUNTER — Ambulatory Visit: Payer: BC Managed Care – PPO | Admitting: Obstetrics and Gynecology

## 2016-08-21 ENCOUNTER — Ambulatory Visit (INDEPENDENT_AMBULATORY_CARE_PROVIDER_SITE_OTHER): Payer: BC Managed Care – PPO | Admitting: Adult Health

## 2016-08-21 ENCOUNTER — Encounter: Payer: Self-pay | Admitting: Adult Health

## 2016-08-21 VITALS — BP 120/80 | HR 80 | Ht 64.0 in | Wt 209.0 lb

## 2016-08-21 DIAGNOSIS — Z1211 Encounter for screening for malignant neoplasm of colon: Secondary | ICD-10-CM | POA: Insufficient documentation

## 2016-08-21 DIAGNOSIS — Z131 Encounter for screening for diabetes mellitus: Secondary | ICD-10-CM

## 2016-08-21 DIAGNOSIS — Z01419 Encounter for gynecological examination (general) (routine) without abnormal findings: Secondary | ICD-10-CM | POA: Diagnosis not present

## 2016-08-21 DIAGNOSIS — Z1212 Encounter for screening for malignant neoplasm of rectum: Secondary | ICD-10-CM

## 2016-08-21 LAB — HEMOCCULT GUIAC POC 1CARD (OFFICE): Fecal Occult Blood, POC: NEGATIVE

## 2016-08-21 NOTE — Progress Notes (Signed)
Patient ID: Samantha [name], female   DOB: 10/19/1965, 51 y.o.   MRN: [id number] History of Present Illness: Samantha [name] is a 51 year old white female,married in for well woman gyn exam,she is sp hysterectomy.  [facility]    Current Medications, Allergies, Past Medical History, Past Surgical History, Family History and Social History were reviewed in [facility] electronic medical record.     Review of Systems: Patient denies any headaches, hearing loss, fatigue, blurred vision, shortness of breath, chest pain, abdominal pain, problems with bowel movements, urination, or intercourse. No joint pain or mood swings.vision does not seem to focus, has glasses and is going to eye doctor, but wants labs, esp to check blood sugar.    Physical Exam:BP 120/80 (BP Location: Left Arm, Patient Position: Sitting, Cuff Size: Small)   Pulse 80   Ht 5\' 4"  (1.626 m)   Wt 209 lb (94.8 kg)   BMI 35.87 kg/m  General:  Well developed, well nourished, no acute distress Skin:  Warm and dry Neck:  Midline trachea, normal thyroid, good ROM, no lymphadenopathy Lungs; Clear to auscultation bilaterally Breast:  No dominant palpable mass, retraction, or nipple discharge Cardiovascular: Regular rate and rhythm Abdomen:  Soft, non tender, no hepatosplenomegaly Pelvic:  External genitalia is normal in appearance, no lesions.  The vagina is normal in appearance. Urethra has no lesions or masses. The cervix and uterus are absent.  No adnexal masses or tenderness noted.Bladder is non tender, no masses felt. Rectal: Good sphincter tone, no polyps, or hemorrhoids felt.  Hemoccult negative. Extremities/musculoskeletal:  No swelling or varicosities noted, no clubbing or cyanosis Psych:  No mood changes, alert and cooperative,seems happy PHQ 9 score 0.  Impression:  1. Well woman exam with routine gynecological exam   2. Screening for colorectal cancer   3. Screening for diabetes mellitus       Plan: Check CBC,CMP,TSH and lipids,A1c and vitamin D Physical in 1 year Mammogram yearly Colonoscopy advised at 6650

## 2016-08-23 LAB — CBC
HEMOGLOBIN: 13.7 g/dL (ref 11.1–15.9)
Hematocrit: 40.9 % (ref 34.0–46.6)
MCH: 29.7 pg (ref 26.6–33.0)
MCHC: 33.5 g/dL (ref 31.5–35.7)
MCV: 89 fL (ref 79–97)
PLATELETS: 329 10*3/uL (ref 150–379)
RBC: 4.62 x10E6/uL (ref 3.77–5.28)
RDW: 14 % (ref 12.3–15.4)
WBC: 6 10*3/uL (ref 3.4–10.8)

## 2016-08-23 LAB — COMPREHENSIVE METABOLIC PANEL
ALBUMIN: 4.2 g/dL (ref 3.5–5.5)
ALT: 14 IU/L (ref 0–32)
AST: 18 IU/L (ref 0–40)
Albumin/Globulin Ratio: 1.6 (ref 1.2–2.2)
Alkaline Phosphatase: 77 IU/L (ref 39–117)
BUN/Creatinine Ratio: 10 (ref 9–23)
BUN: 9 mg/dL (ref 6–24)
Bilirubin Total: 0.8 mg/dL (ref 0.0–1.2)
CALCIUM: 9.3 mg/dL (ref 8.7–10.2)
CO2: 26 mmol/L (ref 18–29)
Chloride: 101 mmol/L (ref 96–106)
Creatinine, Ser: 0.9 mg/dL (ref 0.57–1.00)
GFR calc non Af Amer: 75 mL/min/{1.73_m2} (ref >59)
GFR, EST AFRICAN AMERICAN: 87 mL/min/{1.73_m2} (ref >59)
Globulin, Total: 2.6 g/dL (ref 1.5–4.5)
Glucose: 87 mg/dL (ref 65–99)
POTASSIUM: 5.1 mmol/L (ref 3.5–5.2)
Sodium: 139 mmol/L (ref 134–144)
TOTAL PROTEIN: 6.8 g/dL (ref 6.0–8.5)

## 2016-08-23 LAB — LIPID PANEL
Chol/HDL Ratio: 3.9 ratio units (ref 0.0–4.4)
Cholesterol, Total: 203 mg/dL — ABNORMAL HIGH (ref 100–199)
HDL: 52 mg/dL (ref >39)
LDL Calculated: 121 mg/dL — ABNORMAL HIGH (ref 0–99)
TRIGLYCERIDES: 151 mg/dL — AB (ref 0–149)
VLDL Cholesterol Cal: 30 mg/dL (ref 5–40)

## 2016-08-23 LAB — VITAMIN D 25 HYDROXY (VIT D DEFICIENCY, FRACTURES): VIT D 25 HYDROXY: 27.1 ng/mL — AB (ref 30.0–100.0)

## 2016-08-23 LAB — HEMOGLOBIN A1C
Est. average glucose Bld gHb Est-mCnc: 97 mg/dL
Hgb A1c MFr Bld: 5 % (ref 4.8–5.6)

## 2016-08-23 LAB — TSH: TSH: 1.22 u[IU]/mL (ref 0.450–4.500)

## 2016-08-24 ENCOUNTER — Telehealth: Payer: Self-pay | Admitting: Adult Health

## 2016-08-24 NOTE — Telephone Encounter (Signed)
Pt aware of labs, take 2000 IU vitamin D 3 every day,

## 2017-02-08 ENCOUNTER — Other Ambulatory Visit: Payer: Self-pay | Admitting: Obstetrics and Gynecology

## 2017-02-08 DIAGNOSIS — Z1231 Encounter for screening mammogram for malignant neoplasm of breast: Secondary | ICD-10-CM

## 2017-02-15 ENCOUNTER — Ambulatory Visit: Payer: BC Managed Care – PPO

## 2018-03-09 ENCOUNTER — Encounter: Payer: Self-pay | Admitting: Adult Health

## 2018-03-09 ENCOUNTER — Ambulatory Visit: Payer: BC Managed Care – PPO | Admitting: Adult Health

## 2018-03-09 VITALS — BP 105/62 | HR 80 | Ht 63.0 in | Wt 211.0 lb

## 2018-03-09 DIAGNOSIS — Z01419 Encounter for gynecological examination (general) (routine) without abnormal findings: Secondary | ICD-10-CM

## 2018-03-09 DIAGNOSIS — Z1211 Encounter for screening for malignant neoplasm of colon: Secondary | ICD-10-CM | POA: Diagnosis not present

## 2018-03-09 DIAGNOSIS — Z1212 Encounter for screening for malignant neoplasm of rectum: Secondary | ICD-10-CM | POA: Diagnosis not present

## 2018-03-09 DIAGNOSIS — N3946 Mixed incontinence: Secondary | ICD-10-CM

## 2018-03-09 DIAGNOSIS — N812 Incomplete uterovaginal prolapse: Secondary | ICD-10-CM | POA: Insufficient documentation

## 2018-03-09 LAB — HEMOCCULT GUIAC POC 1CARD (OFFICE): FECAL OCCULT BLD: NEGATIVE

## 2018-03-09 NOTE — Patient Instructions (Signed)

## 2018-03-09 NOTE — Progress Notes (Signed)
Patient ID: Samantha [name], female   DOB: Apr 25, 1966, 52 y.o.   MRN: [id number] History of Present Illness: Samantha [name] is a 52 year old white female, married, sp hysterectomy in for a well woman gyn exam. Husband had kidney removed, for cancer and is on dialysis 2 days a week, but still working.And they are looking after his 45 year old mother. Her 2 girls and son are good.     Current Medications, Allergies, Past Medical History, Past Surgical History, Family History and Social History were reviewed in [facility] record.     Review of Systems: Patient denies any headaches, hearing loss, fatigue, blurred vision, shortness of breath, chest pain, abdominal pain, problems with bowel movements, or intercourse.(not currently) No joint pain or mood swings. Has mixed, UI and urinary frequency at times   Physical Exam:BP 105/62 (BP Location: Left Arm, Patient Position: Sitting, Cuff Size: Large)   Pulse 80   Ht 5\' 3"  (1.6 m)   Wt 211 lb (95.7 kg)   BMI 37.38 kg/m  General:  Well developed, well nourished, no acute distress Skin:  Warm and dry Neck:  Midline trachea, normal thyroid, good ROM, no lymphadenopathy Lungs; Clear to auscultation bilaterally Breast:  No dominant palpable mass, retraction, or nipple discharge Cardiovascular: Regular rate and rhythm Abdomen:  Soft, non tender, no hepatosplenomegaly Pelvic:  External genitalia is normal in appearance, no lesions.  The vagina is normal in appearance, has cystocele. Urethra has no lesions or masses. The cervix and uterus are absent.  No adnexal masses or tenderness noted.Bladder is non tender, no masses felt. Rectal: Good sphincter tone, no polyps, or hemorrhoids felt.  Hemoccult negative. Extremities/musculoskeletal:  No swelling or varicosities noted, no clubbing or cyanosis Psych:  No mood changes, alert and cooperative,seems happy PHQ 2 score 0. Examination chaperoned by Malachy Mood LPN. Discussed cystocele, she  had bladder tack at time of hysterectomy, talked about pessary as option too.   Impression: 1. Well woman exam with routine gynecological exam   2. Screening for colorectal cancer   3. Cystocele with incomplete uterovaginal prolapse   4. Mixed stress and urge urinary incontinence       Plan: Given medical explainer #4 about cystocele,and another handout Physical in 1 year Mammogram now and yearly Labs next year Consider cologuard or colonoscopy in near future, check with insurance

## 2018-03-21 ENCOUNTER — Telehealth: Payer: Self-pay | Admitting: *Deleted

## 2018-03-21 NOTE — Telephone Encounter (Signed)
Pt wants cologuard, to come sign form

## 2018-03-25 ENCOUNTER — Ambulatory Visit (HOSPITAL_COMMUNITY)
Admission: RE | Admit: 2018-03-25 | Discharge: 2018-03-25 | Disposition: A | Discharge status: Home / Self Care | Payer: BC Managed Care – PPO | Source: Ambulatory Visit | Attending: Obstetrics and Gynecology | Admitting: Obstetrics and Gynecology

## 2018-03-25 DIAGNOSIS — Z1231 Encounter for screening mammogram for malignant neoplasm of breast: Secondary | ICD-10-CM | POA: Diagnosis not present

## 2018-03-29 ENCOUNTER — Telehealth: Payer: Self-pay | Admitting: *Deleted

## 2018-03-29 NOTE — Telephone Encounter (Signed)
LMOVM that mammogram was normal.  

## 2018-04-11 ENCOUNTER — Encounter: Payer: Self-pay | Admitting: Adult Health

## 2018-04-11 LAB — COLOGUARD

## 2018-04-18 ENCOUNTER — Telehealth: Payer: Self-pay | Admitting: Adult Health

## 2018-04-18 NOTE — Telephone Encounter (Signed)
Pt aware cologuard is negative.

## 2019-01-08 ENCOUNTER — Encounter (HOSPITAL_COMMUNITY): Payer: Self-pay | Admitting: *Deleted

## 2019-01-08 ENCOUNTER — Other Ambulatory Visit: Payer: Self-pay

## 2019-01-08 ENCOUNTER — Ambulatory Visit (INDEPENDENT_AMBULATORY_CARE_PROVIDER_SITE_OTHER): Payer: BC Managed Care – PPO

## 2019-01-08 ENCOUNTER — Ambulatory Visit (HOSPITAL_COMMUNITY)
Admission: EM | Admit: 2019-01-08 | Discharge: 2019-01-08 | Disposition: A | Discharge status: Home / Self Care | Payer: BC Managed Care – PPO

## 2019-01-08 DIAGNOSIS — R05 Cough: Secondary | ICD-10-CM

## 2019-01-08 DIAGNOSIS — J9801 Acute bronchospasm: Secondary | ICD-10-CM

## 2019-01-08 DIAGNOSIS — R0781 Pleurodynia: Secondary | ICD-10-CM

## 2019-01-08 DIAGNOSIS — R059 Cough, unspecified: Secondary | ICD-10-CM

## 2019-01-08 MED ORDER — ALBUTEROL SULFATE HFA 108 (90 BASE) MCG/ACT IN AERS: 1.0000 | INHALATION_SPRAY | Freq: Four times a day (QID) | RESPIRATORY_TRACT | 0 refills | Status: DC | PRN

## 2019-01-08 MED ORDER — BENZONATATE 100 MG PO CAPS: 100.0000 mg | ORAL_CAPSULE | Freq: Three times a day (TID) | ORAL | 0 refills | Status: DC | PRN

## 2019-01-08 MED ORDER — HYDROCODONE-HOMATROPINE 5-1.5 MG/5ML PO SYRP: 5.0000 mL | ORAL_SOLUTION | Freq: Every evening | ORAL | 0 refills | Status: DC | PRN

## 2019-01-08 NOTE — Discharge Instructions (Signed)
Finish out your antibiotic course.  Use the cough syrup at bedtime only if you continue to have coughing.  It is a controlled substance and can be addictive so be careful with using this medication.  Use albuterol inhaler during the day together with cough capsules to help with cough and bronchospasms.

## 2019-01-08 NOTE — ED Triage Notes (Signed)
C/O cough with multiple office visits over past month.  States cough will not resolve; now c/o right ribcage pain.  Denies any fevers.  Reports negative Covid test via PCP approx 2 wks ago.  Initially was put on doxycycline, but did not finish; currently taking amoxicillin.  Denies SOB.

## 2019-01-08 NOTE — ED Provider Notes (Signed)
MRN: [id number] DOB: October 26, 1965  Subjective:   Samantha [name] is a 53 y.o. female presenting for 1 month history of persistent worsening intermittently productive cough that is now eliciting right lower and lateral chest pain.  Patient has been working with her PCP for the past month on said cough.  She had an x-ray initially which was negative.  Has been managed for bronchitis with 2 injections of steroid, doxycycline.  Of note, patient was unable to tolerate doxycycline and was recently switched to amoxicillin which she is currently taking.  She was also tested for COVID-19 and reports that this was negative a couple of weeks ago.  No current facility-administered medications for this encounter.   Current Outpatient Medications:  .  amoxicillin (AMOXIL) 875 MG tablet, Take 875 mg by mouth 2 (two) times daily., Disp: , Rfl:    Allergies  Allergen Reactions  . Doxycycline     SOB    Past Medical History:  Diagnosis Date  . Choking sensation 01/08/2015  . Folliculitis 0/93/2671     Past Surgical History:  Procedure Laterality Date  . ABDOMINAL HYSTERECTOMY      Review of Systems  Constitutional: Negative for fever and malaise/fatigue.  HENT: Negative for congestion, ear pain, sinus pain and sore throat.   Eyes: Negative for blurred vision, double vision, discharge and redness.  Respiratory: Positive for cough (productive). Negative for hemoptysis, shortness of breath and wheezing.   Cardiovascular: Positive for chest pain (right sided rib pain).  Gastrointestinal: Negative for abdominal pain, diarrhea, nausea and vomiting.  Genitourinary: Negative for dysuria, flank pain and hematuria.  Musculoskeletal: Positive for myalgias.  Skin: Negative for rash.  Neurological: Negative for weakness and headaches.  Psychiatric/Behavioral: Negative for depression and substance abuse.    Objective:   Vitals: BP 119/64   Pulse 71   Temp 97.7 F (36.5 C) (Temporal)   Resp 16   SpO2  100%   Physical Exam Constitutional:      General: She is not in acute distress.    Appearance: Normal appearance. She is well-developed. She is not ill-appearing, toxic-appearing or diaphoretic.  HENT:     Head: Normocephalic and atraumatic.     Nose: Nose normal.     Mouth/Throat:     Mouth: Mucous membranes are moist.  Eyes:     Extraocular Movements: Extraocular movements intact.     Pupils: Pupils are equal, round, and reactive to light.  Cardiovascular:     Rate and Rhythm: Normal rate and regular rhythm.     Pulses: Normal pulses.     Heart sounds: Normal heart sounds. No murmur. No friction rub. No gallop.   Pulmonary:     Effort: Pulmonary effort is normal. No respiratory distress.     Breath sounds: Normal breath sounds. No stridor. No wheezing, rhonchi or rales.  Skin:    General: Skin is warm and dry.     Findings: No rash.  Neurological:     Mental Status: She is alert and oriented to person, place, and time.  Psychiatric:        Mood and Affect: Mood normal.        Behavior: Behavior normal.        Thought Content: Thought content normal.     Dg Chest 2 View  Result Date: 01/08/2019 CLINICAL DATA:  Cough.  Chest pain. EXAM: CHEST - 2 VIEW COMPARISON:  None. FINDINGS: The heart size and mediastinal contours are within normal limits. Both lungs are clear. No  pneumothorax or pleural effusion is noted. The visualized skeletal structures are unremarkable. IMPRESSION: No active cardiopulmonary disease. Electronically Signed   By: Lupita RaiderJames  Green Jr M.D.   On: 01/08/2019 11:49     Assessment and Plan :   1. Cough   2. Rib pain on right side   3. Bronchospasm    Chest x-ray is very reassuring, physical exam findings normal.  Counseled patient that we will use supportive medications for her chronic cough.  This includes albuterol for bronchospasms, and cough suppression medications of Tessalon and Hycodan.  Recommended patient continue to use amoxicillin as prescribed by  her PCP.  We both agreed to no longer use any more steroids as she is already gotten to steroid injections through her PCP.  Patient is to maintain close follow-up with them. Counseled patient on potential for adverse effects with medications prescribed/recommended today, ER and return-to-clinic precautions discussed, patient verbalized understanding.    Wallis BambergMani, Abel Ra, PA-C 01/08/19 1159

## 2019-03-16 ENCOUNTER — Other Ambulatory Visit (HOSPITAL_COMMUNITY): Payer: Self-pay | Admitting: Obstetrics and Gynecology

## 2019-03-16 DIAGNOSIS — Z1231 Encounter for screening mammogram for malignant neoplasm of breast: Secondary | ICD-10-CM

## 2019-03-30 ENCOUNTER — Other Ambulatory Visit: Payer: Self-pay

## 2019-03-30 ENCOUNTER — Ambulatory Visit (HOSPITAL_COMMUNITY)
Admission: RE | Admit: 2019-03-30 | Discharge: 2019-03-30 | Disposition: A | Discharge status: Home / Self Care | Payer: BC Managed Care – PPO | Source: Ambulatory Visit | Attending: Obstetrics and Gynecology | Admitting: Obstetrics and Gynecology

## 2019-03-30 DIAGNOSIS — Z1231 Encounter for screening mammogram for malignant neoplasm of breast: Secondary | ICD-10-CM

## 2019-12-05 ENCOUNTER — Ambulatory Visit (INDEPENDENT_AMBULATORY_CARE_PROVIDER_SITE_OTHER): Payer: BC Managed Care – PPO | Admitting: Adult Health

## 2019-12-05 ENCOUNTER — Encounter: Payer: Self-pay | Admitting: Adult Health

## 2019-12-05 VITALS — BP 107/70 | HR 78 | Ht 63.0 in | Wt 219.0 lb

## 2019-12-05 DIAGNOSIS — Z131 Encounter for screening for diabetes mellitus: Secondary | ICD-10-CM

## 2019-12-05 DIAGNOSIS — Z1211 Encounter for screening for malignant neoplasm of colon: Secondary | ICD-10-CM | POA: Diagnosis not present

## 2019-12-05 DIAGNOSIS — Z1212 Encounter for screening for malignant neoplasm of rectum: Secondary | ICD-10-CM | POA: Diagnosis not present

## 2019-12-05 DIAGNOSIS — Z1321 Encounter for screening for nutritional disorder: Secondary | ICD-10-CM

## 2019-12-05 DIAGNOSIS — M533 Sacrococcygeal disorders, not elsewhere classified: Secondary | ICD-10-CM | POA: Insufficient documentation

## 2019-12-05 DIAGNOSIS — Z01419 Encounter for gynecological examination (general) (routine) without abnormal findings: Secondary | ICD-10-CM | POA: Diagnosis not present

## 2019-12-05 LAB — HEMOCCULT GUIAC POC 1CARD (OFFICE): Fecal Occult Blood, POC: NEGATIVE

## 2019-12-05 MED ORDER — PREDNISONE 10 MG PO TABS: ORAL_TABLET | ORAL | 0 refills | Status: DC

## 2019-12-05 NOTE — Progress Notes (Signed)
Patient ID: Samantha [name], female   DOB: [birth year], 54 y.o.   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 She did see PCP and they told her it was inflamed. Husband doing well, still on dialysis 2 x a week and is on transplant list. PCP is Visual merchandiser at [facility].   Current Medications, Allergies, Past Medical History, Past Surgical History, Family History and Social History were reviewed in [facility] record.     Review of Systems: Patient denies any headaches, hearing loss, fatigue, blurred vision, shortness of breath, chest pain, abdominal pain, problems with bowel movements, urination, or intercourse. No joint pain or mood swings. Has pain in tail bone since November    Physical Exam:BP 107/70 (BP Location: Left Arm, Patient Position: Sitting, Cuff Size: Normal)   Pulse 78   Ht 5\' 3"  (1.6 m)   Wt 219 lb (99.3 kg)   BMI 38.79 kg/m  General:  Well developed, well nourished, no acute distress Skin:  Warm and dry Neck:  Midline trachea, normal thyroid, good ROM, no lymphadenopathy Lungs; Clear to auscultation bilaterally Breast:  No dominant palpable mass, retraction, or nipple discharge Cardiovascular: Regular rate and rhythm Abdomen:  Soft, non tender, no hepatosplenomegaly Pelvic:  External genitalia is normal in appearance, no lesions.  The vagina is normal in appearance. Urethra has no lesions or masses. The cervix and uterus are absent, has cystocele.  No adnexal masses or tenderness noted.Bladder is non tender, no masses felt. Rectal: Good sphincter tone, no polyps, or hemorrhoids felt.  Hemoccult negative. Has pain over tailbone, when pressed from the outside.   Extremities/musculoskeletal:  No swelling or varicosities noted, no clubbing or cyanosis Psych:  No mood changes, alert and cooperative,seems happy AA is 0 PHQ 9 is 2.  Co exam with NP student  Impression and Plan: 1. Well woman exam with routine gynecological exam Physical in 1 year  Check CBC,CMP,TSH and lipids, get fasting, orders given Mammogram yearly   2. Screening for colorectal cancer Referred to Dr Richelle Ito  3. Coccydynia Will rx prednisone to see if helps Meds ordered this encounter  Medications  . predniSONE (DELTASONE) 10 MG tablet    Sig: Take 4 daily at once for 10 day    Dispense:  10 tablet    Refill:  0    Order Specific Question:   Supervising Provider    Answer:   Jena Gauss, LUTHER H [[id number]]    4. Screening for diabetes mellitus Check A1c  5. Encounter for vitamin deficiency screening Check vitamin D

## 2019-12-07 ENCOUNTER — Encounter: Payer: Self-pay | Admitting: Internal Medicine

## 2019-12-12 ENCOUNTER — Telehealth: Payer: Self-pay | Admitting: Adult Health

## 2019-12-12 LAB — COMPREHENSIVE METABOLIC PANEL
ALT: 8 IU/L (ref 0–32)
AST: 10 IU/L (ref 0–40)
Albumin/Globulin Ratio: 2.1 (ref 1.2–2.2)
Albumin: 4.6 g/dL (ref 3.8–4.9)
Alkaline Phosphatase: 76 IU/L (ref 48–121)
BUN/Creatinine Ratio: 13 (ref 9–23)
BUN: 12 mg/dL (ref 6–24)
Bilirubin Total: 0.5 mg/dL (ref 0.0–1.2)
CO2: 23 mmol/L (ref 20–29)
Calcium: 9.2 mg/dL (ref 8.7–10.2)
Chloride: 104 mmol/L (ref 96–106)
Creatinine, Ser: 0.91 mg/dL (ref 0.57–1.00)
GFR calc Af Amer: 84 mL/min/{1.73_m2} (ref >59)
GFR calc non Af Amer: 73 mL/min/{1.73_m2} (ref >59)
Globulin, Total: 2.2 g/dL (ref 1.5–4.5)
Glucose: 114 mg/dL — ABNORMAL HIGH (ref 65–99)
Potassium: 5 mmol/L (ref 3.5–5.2)
Sodium: 139 mmol/L (ref 134–144)
Total Protein: 6.8 g/dL (ref 6.0–8.5)

## 2019-12-12 LAB — CBC
Hematocrit: 40.9 % (ref 34.0–46.6)
Hemoglobin: 13.6 g/dL (ref 11.1–15.9)
MCH: 29.8 pg (ref 26.6–33.0)
MCHC: 33.3 g/dL (ref 31.5–35.7)
MCV: 90 fL (ref 79–97)
Platelets: 363 10*3/uL (ref 150–450)
RBC: 4.57 x10E6/uL (ref 3.77–5.28)
RDW: 12.6 % (ref 11.7–15.4)
WBC: 7 10*3/uL (ref 3.4–10.8)

## 2019-12-12 LAB — HEMOGLOBIN A1C
Est. average glucose Bld gHb Est-mCnc: 111 mg/dL
Hgb A1c MFr Bld: 5.5 % (ref 4.8–5.6)

## 2019-12-12 LAB — LIPID PANEL
Chol/HDL Ratio: 3.9 ratio (ref 0.0–4.4)
Cholesterol, Total: 187 mg/dL (ref 100–199)
HDL: 48 mg/dL (ref >39)
LDL Chol Calc (NIH): 115 mg/dL — ABNORMAL HIGH (ref 0–99)
Triglycerides: 135 mg/dL (ref 0–149)
VLDL Cholesterol Cal: 24 mg/dL (ref 5–40)

## 2019-12-12 LAB — VITAMIN D 25 HYDROXY (VIT D DEFICIENCY, FRACTURES): Vit D, 25-Hydroxy: 28.3 ng/mL — ABNORMAL LOW (ref 30.0–100.0)

## 2019-12-12 LAB — TSH: TSH: 0.934 u[IU]/mL (ref 0.450–4.500)

## 2019-12-12 MED ORDER — VITAMIN D 50 MCG (2000 UT) PO CAPS: ORAL_CAPSULE | ORAL | 99 refills | Status: DC

## 2019-12-12 NOTE — Telephone Encounter (Signed)
Pt aware of labs, has been taking vitamin D 2000 IU increase to 4000 IU

## 2020-01-18 ENCOUNTER — Telehealth: Payer: Self-pay

## 2020-01-18 NOTE — Telephone Encounter (Signed)
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

## 2020-01-18 NOTE — Telephone Encounter (Signed)
Called pt and she was made aware that her appointment is a nurse visit.  She requested it to be done by phone so she doesn't have to miss work.  Confirmed phone number with pt.  She wanted to know how long after the nurse visit that the procedure would be done.  I informed her that it is usually 1 to 2 months.  Pt voiced understanding.

## 2020-02-16 ENCOUNTER — Telehealth: Payer: Self-pay | Admitting: Adult Health

## 2020-02-16 NOTE — Telephone Encounter (Signed)
Patient called and stated she had a colonoscopy scheduled for aug 18th, and they called her to rescheduled it to the end of sept.  She would like to know can she do the colorguard because she will like to just have this done so she can figure out why she is having problems going to the bathroom.

## 2020-02-16 NOTE — Telephone Encounter (Signed)
Left message that cologuard will not help with that just checks for colon cancer DNA, call GI and try to get appt sooner

## 2020-02-21 ENCOUNTER — Ambulatory Visit: Payer: BC Managed Care – PPO

## 2020-04-02 ENCOUNTER — Other Ambulatory Visit: Payer: Self-pay

## 2020-04-02 ENCOUNTER — Ambulatory Visit (INDEPENDENT_AMBULATORY_CARE_PROVIDER_SITE_OTHER): Payer: Self-pay | Admitting: *Deleted

## 2020-04-02 DIAGNOSIS — Z1211 Encounter for screening for malignant neoplasm of colon: Secondary | ICD-10-CM

## 2020-04-02 NOTE — Progress Notes (Signed)
Gastroenterology Pre-Procedure Review  Request Date: 04/02/2020 Requesting Physician: Cyril Mourning, NP, no previous TCS  PATIENT REVIEW QUESTIONS: The patient responded to the following health history questions as indicated:    1. Diabetes Melitis: no 2. Joint replacements in the past 12 months: no 3. Major health problems in the past 3 months: no 4. Has an artificial valve or MVP: no 5. Has a defibrillator: no 6. Has been advised in past to take antibiotics in advance of a procedure like teeth cleaning: no 7. Family history of colon cancer: no 8. Alcohol Use: no 9. Illicit drug Use: no 10. History of sleep apnea: no  11. History of coronary artery or other vascular stents placed within the last 12 months: no 12. History of any prior anesthesia complications: no 13. There is no height or weight on file to calculate BMI. ht: 5'4 wt: 190 lbs    MEDICATIONS & ALLERGIES:    Patient reports the following regarding taking any blood thinners:   Plavix? no Aspirin? no Coumadin? no Brilinta? no Xarelto? no Eliquis? no Pradaxa? no Savaysa? no Effient? no  Patient confirms/reports the following medications:  Current Outpatient Medications  Medication Sig Dispense Refill  . Cholecalciferol (VITAMIN D) 50 MCG (2000 UT) CAPS Take 2 daily 30 capsule prn   No current facility-administered medications for this visit.    Patient confirms/reports the following allergies:  Allergies  Allergen Reactions  . Doxycycline     SOB    No orders of the defined types were placed in this encounter.   AUTHORIZATION INFORMATION Primary Insurance: BCBS [place],  [place] #:[id number],  Group #:[id number] Pre-Cert / Auth required: No, not required  SCHEDULE INFORMATION: Procedure has been scheduled as follows:  Date: 04/22/2020, Time: 9:30 Location: [facility] with Dr. Marletta Lor  This Gastroenterology Pre-Precedure Review Form is being routed to the following provider(s): Lewie Loron, NP

## 2020-04-02 NOTE — Patient Instructions (Addendum)
Samantha [name]   05/14/1966 MRN: [id number]    Procedure Date: 04/29/2020 Time to register: 9:30 am Place to register: [facility] Procedure Time: 11:00 Scheduled provider: Dr. Abbey Chatters  PREPARATION FOR COLONOSCOPY WITH TRI-LYTE SPLIT PREP  Please notify us immediately if you are diabetic, take iron supplements, or if you are on Coumadin or any other blood thinners.   Please hold the following medications: n/a  You will need to purchase 1 fleet enema and 1 box of Bisacodyl 6m tablets.   2 DAYS BEFORE PROCEDURE:  DATE: 04/27/2020   DAY: Saturday Begin clear liquid diet AFTER your lunch meal. NO SOLID FOODS after this point.  1 DAY BEFORE PROCEDURE:  DATE: 04/28/2020   DAY: Sunday Continue clear liquids the entire day - NO SOLID FOOD.   Diabetic medications adjustments for today: n/a  At 2:00 pm:  Take 2 Bisacodyl tablets.   At 4:00pm:  Start drinking your solution. Make sure you mix well per instructions on the bottle. Try to drink 1 (one) 8 ounce glass every 10-15 minutes until you have consumed HALF the jug. You should complete by 6:00pm.You must keep the left over solution refrigerated until completed next day.  Continue clear liquids. You must drink plenty of clear liquids to prevent dehyration and kidney failure.     DAY OF PROCEDURE:   DATE: 04/29/2020   DAY: Monday If you take medications for your heart, blood pressure or breathing, you may take these medications.  Diabetic medications adjustments for today: n/a  Five hours before your procedure time @ 6:00 am:  Finish remaining amout of bowel prep, drinking 1 (one) 8 ounce glass every 10-15 minutes until complete. You have two hours to consume remaining prep.   Three hours before your procedure time @ 8:00 am:  Nothing by mouth.   At least one hour before going to the hospital:  Give yourself one Fleet enema. You may take your morning medications with sip of water unless we have instructed otherwise.       Please see below for Dietary Information.  CLEAR LIQUIDS INCLUDE:  Water Jello (NOT red in color)   Ice Popsicles (NOT red in color)   Tea (sugar ok, no milk/cream) Powdered fruit flavored drinks  Coffee (sugar ok, no milk/cream) Gatorade/ Lemonade/ Kool-Aid  (NOT red in color)   Juice: apple, white grape, white cranberry Soft drinks  Clear bullion, consomme, broth (fat free beef/chicken/vegetable)  Carbonated beverages (any kind)  Strained chicken noodle soup Hard Candy   Remember: Clear liquids are liquids that will allow you to see your fingers on the other side of a clear glass. Be sure liquids are NOT red in color, and not cloudy, but CLEAR.  DO NOT EAT OR DRINK ANY OF THE FOLLOWING:  Dairy products of any kind   Cranberry juice Tomato juice / V8 juice   Grapefruit juice Orange juice     Red grape juice  Do not eat any solid foods, including such foods as: cereal, oatmeal, yogurt, fruits, vegetables, creamed soups, eggs, bread, crackers, pureed foods in a blender, etc.   HELPFUL HINTS FOR DRINKING PREP SOLUTION:   Make sure prep is extremely cold. Mix and refrigerate the the morning of the prep. You may also put in the freezer.   You may try mixing some Crystal Light or Country Time Lemonade if you prefer. Mix in small amounts; add more if necessary.  Try drinking through a straw  Rinse mouth with water or a  mouthwash between glasses, to remove after-taste.  Try sipping on a cold beverage /ice/ popsicles between glasses of prep.  Place a piece of sugar-free hard candy in mouth between glasses.  If you become nauseated, try consuming smaller amounts, or stretch out the time between glasses. Stop for 30-60 minutes, then slowly start back drinking.        OTHER INSTRUCTIONS  You will need a responsible adult at least 53 years of age to accompany you and drive you home. This person must remain in the waiting room during your procedure. The hospital will cancel  your procedure if you do not have a responsible adult with you.   1. Wear loose fitting clothing that is easily removed. 2. Leave jewelry and other valuables at home.  3. Remove all body piercing jewelry and leave at home. 4. Total time from sign-in until discharge is approximately 2-3 hours. 5. You should go home directly after your procedure and rest. You can resume normal activities the day after your procedure. 6. The day of your procedure you should not:  Drive  Make legal decisions  Operate machinery  Drink alcohol  Return to work   You may call the office (Dept: [phone]) before 5:00pm, or page the doctor on call [phone]) after 5:00pm, for further instructions, if necessary.   Insurance Information YOU WILL NEED TO CHECK WITH YOUR INSURANCE COMPANY FOR THE BENEFITS OF COVERAGE YOU HAVE FOR THIS PROCEDURE.  UNFORTUNATELY, NOT ALL INSURANCE COMPANIES HAVE BENEFITS TO COVER ALL OR PART OF THESE TYPES OF PROCEDURES.  IT IS YOUR RESPONSIBILITY TO CHECK YOUR BENEFITS, HOWEVER, WE WILL BE GLAD TO ASSIST YOU WITH ANY CODES YOUR INSURANCE COMPANY MAY NEED.    PLEASE NOTE THAT MOST INSURANCE COMPANIES WILL NOT COVER A SCREENING COLONOSCOPY FOR PEOPLE UNDER THE AGE OF 50  IF YOU HAVE BCBS INSURANCE, YOU MAY HAVE BENEFITS FOR A SCREENING COLONOSCOPY BUT IF POLYPS ARE FOUND THE DIAGNOSIS WILL CHANGE AND THEN YOU MAY HAVE A DEDUCTIBLE THAT WILL NEED TO BE MET. SO PLEASE MAKE SURE YOU CHECK YOUR BENEFITS FOR A SCREENING COLONOSCOPY AS WELL AS A DIAGNOSTIC COLONOSCOPY.

## 2020-04-04 ENCOUNTER — Telehealth: Payer: Self-pay | Admitting: *Deleted

## 2020-04-04 NOTE — Telephone Encounter (Signed)
Pt called and requested her procedure to be changed.  She requested 04/29/2020.  Pt is aware to arrive at 9:30 to register.  She rescheduled her Covid screening to 04/26/2020 at 3:10.  Pt is aware that I am mailing out new prep instructions and Covid screening information.

## 2020-04-10 NOTE — Progress Notes (Signed)
Appropriate. ASA 1.

## 2020-04-19 ENCOUNTER — Other Ambulatory Visit (HOSPITAL_COMMUNITY): Payer: BC Managed Care – PPO

## 2020-04-24 ENCOUNTER — Other Ambulatory Visit: Payer: Self-pay | Admitting: *Deleted

## 2020-04-24 MED ORDER — PEG 3350-KCL-NA BICARB-NACL 420 G PO SOLR: 4000.0000 mL | Freq: Once | ORAL | 0 refills | Status: AC

## 2020-04-24 NOTE — Telephone Encounter (Signed)
Pt requested to change MiraLax prep.  Called [facility] to make sure that they have Trilyte prep.  They do have it in stock.  Sent in RX and informed pt.  Will fax prep instructions to [facility] and email them to her (per pt request) at [email or web address].  Routing to Lewie Loron, NP as Lorain Childes since she signed off on original triage.

## 2020-04-24 NOTE — Telephone Encounter (Signed)
Lmom for pt to call me back. 

## 2020-04-24 NOTE — Telephone Encounter (Signed)
Patient would like to have another prep sent in to [facility] in [place], because she doesn't want to do the Miralax prep.  Routing to Angie you can be reached [phone].

## 2020-04-24 NOTE — Telephone Encounter (Signed)
Left another message for pt to call me back.

## 2020-04-26 ENCOUNTER — Other Ambulatory Visit (HOSPITAL_COMMUNITY)
Admission: RE | Admit: 2020-04-26 | Discharge: 2020-04-26 | Disposition: A | Discharge status: Home / Self Care | Payer: BC Managed Care – PPO | Source: Ambulatory Visit | Attending: Internal Medicine | Admitting: Internal Medicine

## 2020-04-26 ENCOUNTER — Other Ambulatory Visit: Payer: Self-pay

## 2020-04-26 DIAGNOSIS — Z20822 Contact with and (suspected) exposure to covid-19: Secondary | ICD-10-CM | POA: Diagnosis not present

## 2020-04-26 DIAGNOSIS — Z01812 Encounter for preprocedural laboratory examination: Secondary | ICD-10-CM | POA: Diagnosis not present

## 2020-04-27 LAB — SARS CORONAVIRUS 2 (TAT 6-24 HRS): SARS Coronavirus 2: NEGATIVE

## 2020-04-29 ENCOUNTER — Encounter (HOSPITAL_COMMUNITY)
Admission: RE | Disposition: A | Discharge status: Home / Self Care | Payer: Self-pay | Source: Home / Self Care | Attending: Internal Medicine

## 2020-04-29 ENCOUNTER — Ambulatory Visit (HOSPITAL_COMMUNITY)
Admission: RE | Admit: 2020-04-29 | Discharge: 2020-04-29 | Disposition: A | Discharge status: Home / Self Care | Payer: BC Managed Care – PPO | Attending: Internal Medicine | Admitting: Internal Medicine

## 2020-04-29 ENCOUNTER — Encounter (HOSPITAL_COMMUNITY): Payer: Self-pay | Admitting: *Deleted

## 2020-04-29 ENCOUNTER — Ambulatory Visit (HOSPITAL_COMMUNITY): Payer: BC Managed Care – PPO | Admitting: Anesthesiology

## 2020-04-29 ENCOUNTER — Other Ambulatory Visit: Payer: Self-pay

## 2020-04-29 DIAGNOSIS — Z881 Allergy status to other antibiotic agents status: Secondary | ICD-10-CM | POA: Insufficient documentation

## 2020-04-29 DIAGNOSIS — Z1211 Encounter for screening for malignant neoplasm of colon: Secondary | ICD-10-CM | POA: Diagnosis not present

## 2020-04-29 DIAGNOSIS — Q438 Other specified congenital malformations of intestine: Secondary | ICD-10-CM | POA: Insufficient documentation

## 2020-04-29 DIAGNOSIS — K5140 Inflammatory polyps of colon without complications: Secondary | ICD-10-CM | POA: Diagnosis not present

## 2020-04-29 DIAGNOSIS — K635 Polyp of colon: Secondary | ICD-10-CM | POA: Diagnosis not present

## 2020-04-29 HISTORY — PX: COLONOSCOPY WITH PROPOFOL: SHX5780

## 2020-04-29 HISTORY — PX: POLYPECTOMY: SHX5525

## 2020-04-29 SURGERY — COLONOSCOPY WITH PROPOFOL
Anesthesia: General

## 2020-04-29 MED ORDER — STERILE WATER FOR IRRIGATION IR SOLN
Status: DC | PRN
  Administered 2020-04-29: 100 mL

## 2020-04-29 MED ORDER — LACTATED RINGERS IV SOLN: Freq: Once | INTRAVENOUS | Status: AC

## 2020-04-29 MED ORDER — PROPOFOL 10 MG/ML IV BOLUS
INTRAVENOUS | Status: DC | PRN
  Administered 2020-04-29 (×2): 20 mg via INTRAVENOUS
  Administered 2020-04-29: 100 mg via INTRAVENOUS

## 2020-04-29 MED ORDER — PROPOFOL 500 MG/50ML IV EMUL
INTRAVENOUS | Status: DC | PRN
  Administered 2020-04-29 (×2): 75 ug/kg/min via INTRAVENOUS

## 2020-04-29 MED ORDER — LIDOCAINE HCL (CARDIAC) PF 50 MG/5ML IV SOSY
PREFILLED_SYRINGE | INTRAVENOUS | Status: DC | PRN
  Administered 2020-04-29: 100 mg via INTRAVENOUS

## 2020-04-29 MED ORDER — LACTATED RINGERS IV SOLN: INTRAVENOUS | Status: DC | PRN

## 2020-04-29 NOTE — Transfer of Care (Signed)
Immediate Anesthesia Transfer of Care Note  Patient: Samantha [name]  Procedure(s) Performed: COLONOSCOPY WITH PROPOFOL (N/A ) POLYPECTOMY  Patient Location: Endoscopy Unit  Anesthesia Type:General  Level of Consciousness: awake and patient cooperative  Airway & Oxygen Therapy: Patient Spontanous Breathing  Post-op Assessment: Report given to RN and Post -op Vital signs reviewed and stable  Post vital signs: Reviewed and stable  Last Vitals:  Vitals Value Taken Time  BP    Temp    Pulse    Resp    SpO2      Last Pain:  Vitals:   04/29/20 1121  TempSrc:   PainSc: 0-No pain      Patients Stated Pain Goal: 7 (04/29/20 0959)  Complications: No complications documented.

## 2020-04-29 NOTE — Op Note (Addendum)
[facility] Patient Name: Samantha [name] Procedure Date: 04/29/2020 10:09 AM MRN: [id number] Date of Birth: 12-19-1965 Attending MD: Elon Alas. Edgar Frisk CSN: [id number] Age: 54 Admit Type: Outpatient Procedure:                Colonoscopy Indications:              Screening for colorectal malignant neoplasm Providers:                Elon Alas. Abbey Chatters, DO, Crystal Page, Randa Spike, Technician Referring MD:              Medicines:                See the Anesthesia note for documentation of the                            administered medications Complications:            No immediate complications. Estimated Blood Loss:     Estimated blood loss was minimal. Procedure:                Pre-Anesthesia Assessment:                           - The anesthesia plan was to use monitored                            anesthesia care (MAC).                           After obtaining informed consent, the colonoscope                            was passed under direct vision. Throughout the                            procedure, the patient's blood pressure, pulse, and                            oxygen saturations were monitored continuously. The                            PCF-[id number] ([id number]) scope was introduced through                            the anus and advanced to the the cecum, identified                            by appendiceal orifice and ileocecal valve. The                            colonoscopy was technically difficult and complex                            due to significant looping. Successful completion  of the procedure was aided by applying abdominal                            pressure. The patient tolerated the procedure well.                            The quality of the bowel preparation was evaluated                            using the BBPS [facility] Bowel Preparation Scale)                            with scores  of: Right Colon = 3, Transverse Colon =                            3 and Left Colon = 3 (entire mucosa seen well with                            no residual staining, small fragments of stool or                            opaque liquid). The total BBPS score equals 9. Scope In: 11:26:48 AM Scope Out: 11:43:37 AM Scope Withdrawal Time: 0 hours 8 minutes 43 seconds  Total Procedure Duration: 0 hours 16 minutes 49 seconds  Findings:      The perianal and digital rectal examinations were normal.      A 4 mm polyp was found in the descending colon. The polyp was sessile.       The polyp was removed with a cold snare. Resection and retrieval were       complete.      The colon (entire examined portion) was moderately tortuous. Advancing       the scope required applying abdominal pressure.      The colon (entire examined portion) revealed excessive looping. Impression:               - One 4 mm polyp in the descending colon, removed                            with a cold snare. Resected and retrieved.                           - Tortuous colon.                           - There was significant looping of the colon. Moderate Sedation:      Per Anesthesia Care Recommendation:           - Patient has a contact number available for                            emergencies. The signs and symptoms of potential                            delayed complications were discussed with the  patient. Return to normal activities tomorrow.                            Written discharge instructions were provided to the                            patient.                           - Resume previous diet.                           - Continue present medications.                           - Await pathology results.                           - Repeat colonoscopy in 7-10 years for surveillance                            based on pathology results.                           - Return to GI  clinic PRN. Procedure Code(s):        --- Professional ---                           [phone], Colonoscopy, flexible; with removal of                            tumor(s), polyp(s), or other lesion(s) by snare                            technique Diagnosis Code(s):        --- Professional ---                           Z12.11, Encounter for screening for malignant                            neoplasm of colon                           K63.5, Polyp of colon                           Q43.8, Other specified congenital malformations of                            intestine CPT copyright 2019 American Medical Association. All rights reserved. The codes documented in this report are preliminary and upon coder review may  be revised to meet current compliance requirements. Elon Alas. Abbey Chatters, DO Huey Abbey Chatters, DO 04/29/2020 11:15:29 AM This report has been signed electronically. Number of Addenda: 0

## 2020-04-29 NOTE — Anesthesia Postprocedure Evaluation (Signed)
Anesthesia Post Note  Patient: Samantha [name]  Procedure(s) Performed: COLONOSCOPY WITH PROPOFOL (N/A ) POLYPECTOMY  Patient location during evaluation: Endoscopy Anesthesia Type: General Level of consciousness: awake and alert and patient cooperative Pain management: satisfactory to patient Vital Signs Assessment: post-procedure vital signs reviewed and stable Respiratory status: spontaneous breathing Cardiovascular status: stable Postop Assessment: no apparent nausea or vomiting Anesthetic complications: no   No complications documented.   Last Vitals:  Vitals:   04/29/20 0959 04/29/20 1149  BP: 118/75 (!) 102/59  Pulse: 67   Resp: 17 12  Temp: 36.6 C 36.4 C  SpO2: 100% 100%    Last Pain:  Vitals:   04/29/20 1149  TempSrc: Oral  PainSc: 0-No pain                 Velta Rockholt

## 2020-04-29 NOTE — Anesthesia Preprocedure Evaluation (Addendum)
Anesthesia Evaluation  Patient identified by MRN, date of birth, ID band Patient awake    Reviewed: Allergy & Precautions, NPO status , Patient's Chart, lab work & pertinent test results  Airway Mallampati: II  TM Distance: >3 FB Neck ROM: Full    Dental  (+) Dental Advisory Given, Teeth Intact   Pulmonary neg pulmonary ROS,    Pulmonary exam normal breath sounds clear to auscultation       Cardiovascular Exercise Tolerance: Good Normal cardiovascular exam Rhythm:Regular Rate:Normal     Neuro/Psych negative neurological ROS  negative psych ROS   GI/Hepatic negative GI ROS, Neg liver ROS,   Endo/Other  negative endocrine ROS  Renal/GU negative Renal ROS     Musculoskeletal negative musculoskeletal ROS (+)   Abdominal   Peds  Hematology negative hematology ROS (+)   Anesthesia Other Findings   Reproductive/Obstetrics negative OB ROS                             Anesthesia Physical Anesthesia Plan  ASA: II  Anesthesia Plan: General   Post-op Pain Management:    Induction: Intravenous  PONV Risk Score and Plan: TIVA  Airway Management Planned: Nasal Cannula and Natural Airway  Additional Equipment:   Intra-op Plan:   Post-operative Plan:   Informed Consent: I have reviewed the patients History and Physical, chart, labs and discussed the procedure including the risks, benefits and alternatives for the proposed anesthesia with the patient or authorized representative who has indicated his/her understanding and acceptance.     Dental advisory given  Plan Discussed with: CRNA and Surgeon  Anesthesia Plan Comments:         Anesthesia Quick Evaluation

## 2020-04-29 NOTE — H&P (Signed)
Primary Care Physician:  [facility] Gastroenterologist:  Dr. Marletta Lor  Pre-Procedure History & Physical: HPI:  Samantha [name] is a 54 y.o. female is here for a colonoscopy for colon cancer screening purposes.  Patient denies any family history of colorectal cancer.  No melena or hematochezia.  No abdominal pain or unintentional weight loss.  No change in bowel habits.  Overall feels well from a GI standpoint.  Past Medical History:  Diagnosis Date  . Choking sensation 01/08/2015  . Folliculitis 12/28/2013    Past Surgical History:  Procedure Laterality Date  . ABDOMINAL HYSTERECTOMY      Prior to Admission medications   Medication Sig Start Date End Date Taking? Authorizing Provider  APPLE CIDER VINEGAR PO Take 2 capsules by mouth daily. gummies   Yes [provider]  Cholecalciferol (VITAMIN D) 50 MCG (2000 UT) CAPS Take 2 daily Patient taking differently: Take 200 Units by mouth daily.  12/12/19  Yes Adline Potter, NP    Allergies as of 04/10/2020 - Review Complete 04/02/2020  Allergen Reaction Noted  . Doxycycline  01/08/2019    Family History  Problem Relation Age of Onset  . Other Mother        knee pain  . Other Father        brain tumor  . Other Sister        panic attacks  . Thyroid disease Sister        had thyroid removed  . Cancer Sister        breast  . Diabetes Sister   . Colon cancer Neg Hx     Social History   Socioeconomic History  . Marital status: Married    Spouse name: Not on file  . Number of children: Not on file  . Years of education: Not on file  . Highest education level: Not on file  Occupational History  . Not on file  Tobacco Use  . Smoking status: Never Smoker  . Smokeless tobacco: Never Used  Vaping Use  . Vaping Use: Never used  Substance and Sexual Activity  . Alcohol use: No  . Drug use: No  . Sexual activity: Not Currently    Birth control/protection: Surgical     Comment: hyst  Other Topics Concern  . Not on file  Social History Narrative  . Not on file   Social Determinants of Health   Financial Resource Strain: Low Risk   . Difficulty of Paying Living Expenses: Not hard at all  Food Insecurity: No Food Insecurity  . Worried About Programme researcher, broadcasting/film/video in the Last Year: Never true  . Ran Out of Food in the Last Year: Never true  Transportation Needs: No Transportation Needs  . Lack of Transportation (Medical): No  . Lack of Transportation (Non-Medical): No  Physical Activity: Insufficiently Active  . Days of Exercise per Week: 2 days  . Minutes of Exercise per Session: 30 min  Stress: No Stress Concern Present  . Feeling of Stress : Not at all  Social Connections: Socially Integrated  . Frequency of Communication with Friends and Family: Three times a week  . Frequency of Social Gatherings with Friends and Family: Once a week  . Attends Religious Services: More than 4 times per year  . Active Member of Clubs or Organizations: Yes  . Attends Banker Meetings: More than 4 times per year  . Marital Status: Married  Catering manager Violence: Not At Risk  .  Fear of Current or Ex-Partner: No  . Emotionally Abused: No  . Physically Abused: No  . Sexually Abused: No    Review of Systems: See HPI, otherwise negative ROS  Impression/Plan: Samantha [name] is here for a colonoscopy to be performed for colon cancer screening purposes.  The risks of the procedure including infection, bleed, or perforation as well as benefits, limitations, alternatives and imponderables have been reviewed with the patient. Questions have been answered. All parties agreeable.

## 2020-04-29 NOTE — Discharge Instructions (Addendum)
  Colonoscopy Discharge Instructions  Read the instructions outlined below and refer to this sheet in the next few weeks. These discharge instructions provide you with general information on caring for yourself after you leave the hospital. Your doctor may also give you specific instructions. While your treatment has been planned according to the most current medical practices available, unavoidable complications occasionally occur.   ACTIVITY  You may resume your regular activity, but move at a slower pace for the next 24 hours.   Take frequent rest periods for the next 24 hours.   Walking will help get rid of the air and reduce the bloated feeling in your belly (abdomen).   No driving for 24 hours (because of the medicine (anesthesia) used during the test).    Do not sign any important legal documents or operate any machinery for 24 hours (because of the anesthesia used during the test).  NUTRITION  Drink plenty of fluids.   You may resume your normal diet as instructed by your doctor.   Begin with a light meal and progress to your normal diet. Heavy or fried foods are harder to digest and may make you feel sick to your stomach (nauseated).   Avoid alcoholic beverages for 24 hours or as instructed.  MEDICATIONS  You may resume your normal medications unless your doctor tells you otherwise.  WHAT YOU CAN EXPECT TODAY  Some feelings of bloating in the abdomen.   Passage of more gas than usual.   Spotting of blood in your stool or on the toilet paper.  IF YOU HAD POLYPS REMOVED DURING THE COLONOSCOPY:  No aspirin products for 7 days or as instructed.   No alcohol for 7 days or as instructed.   Eat a soft diet for the next 24 hours.  FINDING OUT THE RESULTS OF YOUR TEST Not all test results are available during your visit. If your test results are not back during the visit, make an appointment with your caregiver to find out the results. Do not assume everything is normal if  you have not heard from your caregiver or the medical facility. It is important for you to follow up on all of your test results.  SEEK IMMEDIATE MEDICAL ATTENTION IF:  You have more than a spotting of blood in your stool.   Your belly is swollen (abdominal distention).   You are nauseated or vomiting.   You have a temperature over 101.   You have abdominal pain or discomfort that is severe or gets worse throughout the day.   Your colonoscopy revealed 1 polyp(s) which I removed successfully. Await pathology results, my office will contact you. I recommend repeating colonoscopy in 7-10 years for surveillance purposes depending on pathology. Follow up with GI as needed.   I hope you have a great rest of your week!  Hennie Duos. Marletta Lor, D.O. Gastroenterology and Hepatology [facility]

## 2020-04-29 NOTE — Anesthesia Procedure Notes (Signed)
Date/Time: 04/29/2020 11:20 AM Performed by: Franco Nones, CRNA Pre-anesthesia Checklist: Patient identified, Emergency Drugs available, Suction available, Timeout performed and Patient being monitored Patient Re-evaluated:Patient Re-evaluated prior to induction Oxygen Delivery Method: Nasal Cannula

## 2020-04-30 LAB — SURGICAL PATHOLOGY

## 2020-05-02 ENCOUNTER — Encounter (HOSPITAL_COMMUNITY): Payer: Self-pay | Admitting: Internal Medicine

## 2020-12-19 ENCOUNTER — Other Ambulatory Visit: Payer: Self-pay

## 2020-12-19 ENCOUNTER — Ambulatory Visit (INDEPENDENT_AMBULATORY_CARE_PROVIDER_SITE_OTHER): Payer: BC Managed Care – PPO | Admitting: Adult Health

## 2020-12-19 ENCOUNTER — Encounter: Payer: Self-pay | Admitting: Adult Health

## 2020-12-19 VITALS — BP 99/70 | HR 81 | Ht 62.5 in | Wt 207.5 lb

## 2020-12-19 DIAGNOSIS — Z01419 Encounter for gynecological examination (general) (routine) without abnormal findings: Secondary | ICD-10-CM | POA: Diagnosis not present

## 2020-12-19 DIAGNOSIS — Z90710 Acquired absence of both cervix and uterus: Secondary | ICD-10-CM | POA: Insufficient documentation

## 2020-12-19 DIAGNOSIS — Z1231 Encounter for screening mammogram for malignant neoplasm of breast: Secondary | ICD-10-CM

## 2020-12-19 DIAGNOSIS — Z1211 Encounter for screening for malignant neoplasm of colon: Secondary | ICD-10-CM

## 2020-12-19 LAB — HEMOCCULT GUIAC POC 1CARD (OFFICE): Fecal Occult Blood, POC: NEGATIVE

## 2020-12-19 NOTE — Progress Notes (Signed)
Patient ID: Samantha [name], female   DOB: 1965-11-10, 55 y.o.   MRN: [id number] History of Present Illness: Samantha [name] is a 55 year old white female, married, sp hysterectomy in for well woman gyn exam. Husband still on transplant list.  PCP is [facility] in [place]   Current Medications, Allergies, Past Medical History, Past Surgical History, Family History and Social History were reviewed in [employer] record.     Review of Systems:  Patient denies any headaches, hearing loss, fatigue, blurred vision, shortness of breath, chest pain, abdominal pain, problems with bowel movements, urination, or intercourse. (Not active)No joint pain or mood swings.    Physical Exam:BP 99/70 (BP Location: Left Arm, Patient Position: Sitting, Cuff Size: Large)   Pulse 81   Ht 5' 2.5" (1.588 m)   Wt 207 lb 8 oz (94.1 kg)   BMI 37.35 kg/m   General:  Well developed, well nourished, no acute distress Skin:  Warm and dry Neck:  Midline trachea, normal thyroid, good ROM, no lymphadenopathy Lungs; Clear to auscultation bilaterally Breast:  No dominant palpable mass, retraction, or nipple discharge Cardiovascular: Regular rate and rhythm Abdomen:  Soft, non tender, no hepatosplenomegaly Pelvic:  External genitalia is normal in appearance, no lesions.  The vagina is normal in appearance. Urethra has no lesions or masses. The cervix and uterus are absent. No adnexal masses or tenderness noted.Bladder is non tender, no masses felt. Rectal: Good sphincter tone, no polyps, + hemorrhoids felt.  Hemoccult negative. Extremities/musculoskeletal:  No swelling or varicosities noted, no clubbing or cyanosis Psych:  No mood changes, alert and cooperative,seems happy AA is 0 Fall risk is low Depression screen [facility] 2/9 12/19/2020 12/05/2019 03/09/2018  Decreased Interest 0 0 0  Down, Depressed, Hopeless 0 0 0  PHQ - 2 Score 0 0 0  Altered sleeping 0 0 -  Tired, decreased energy 0 2 -  Change  in appetite 0 0 -  Feeling bad or failure about yourself  0 0 -  Trouble concentrating 0 0 -  Moving slowly or fidgety/restless 0 0 -  Suicidal thoughts 0 0 -  PHQ-9 Score 0 2 -  Difficult doing work/chores - - -    GAD 7 : Generalized Anxiety Score 12/19/2020 12/05/2019  Nervous, Anxious, on Edge 0 0  Control/stop worrying 0 0  Worry too much - different things 0 0  Trouble relaxing 0 0  Restless 0 0  Easily annoyed or irritable 0 0  Afraid - awful might happen 0 0  Total GAD 7 Score 0 0      Upstream - 12/19/20 1447       Pregnancy Intention Screening   Does the patient want to become pregnant in the next year? N/A    Does the patient's partner want to become pregnant in the next year? N/A    Would the patient like to discuss contraceptive options today? N/A      Contraception Wrap Up   Current Method Female Sterilization   hyst   End Method Female Sterilization   hyst   Contraception Counseling Provided No            Exam done with out chaperone with verbal consent.   Impression and Plan: 1. Screening mammogram for breast cancer Scheduled for her 6/20 at 10:15 am at [facility] - MM 3D SCREEN BREAST BILATERAL; Future  2. Encounter for well woman exam with routine gynecological exam Physical in 1 year Labs with PCP  Colonoscopy  per GI   3. Encounter for screening fecal occult blood testing   4. S/P hysterectomy No pap needed

## 2020-12-23 ENCOUNTER — Other Ambulatory Visit: Payer: Self-pay

## 2020-12-23 ENCOUNTER — Ambulatory Visit (HOSPITAL_COMMUNITY)
Admission: RE | Admit: 2020-12-23 | Discharge: 2020-12-23 | Disposition: A | Discharge status: Home / Self Care | Payer: BC Managed Care – PPO | Source: Ambulatory Visit | Attending: Adult Health | Admitting: Adult Health

## 2020-12-23 DIAGNOSIS — Z1231 Encounter for screening mammogram for malignant neoplasm of breast: Secondary | ICD-10-CM | POA: Insufficient documentation

## 2020-12-24 ENCOUNTER — Telehealth: Payer: Self-pay | Admitting: Adult Health

## 2020-12-24 NOTE — Telephone Encounter (Signed)
Left message that mammogram was normal, repeat in 1 year, no evidence of malignancy

## 2021-12-12 ENCOUNTER — Other Ambulatory Visit (HOSPITAL_COMMUNITY): Payer: Self-pay | Admitting: Adult Health

## 2021-12-12 DIAGNOSIS — Z1231 Encounter for screening mammogram for malignant neoplasm of breast: Secondary | ICD-10-CM

## 2021-12-26 ENCOUNTER — Ambulatory Visit (HOSPITAL_COMMUNITY): Payer: No Typology Code available for payment source

## 2022-01-26 ENCOUNTER — Ambulatory Visit (HOSPITAL_COMMUNITY)
Admission: RE | Admit: 2022-01-26 | Discharge: 2022-01-26 | Disposition: A | Discharge status: Home / Self Care | Payer: No Typology Code available for payment source | Source: Ambulatory Visit | Attending: Adult Health | Admitting: Adult Health

## 2022-01-26 DIAGNOSIS — Z1231 Encounter for screening mammogram for malignant neoplasm of breast: Secondary | ICD-10-CM | POA: Insufficient documentation

## 2022-01-28 ENCOUNTER — Other Ambulatory Visit (HOSPITAL_COMMUNITY): Payer: Self-pay | Admitting: Adult Health

## 2022-01-28 DIAGNOSIS — R928 Other abnormal and inconclusive findings on diagnostic imaging of breast: Secondary | ICD-10-CM

## 2022-02-10 ENCOUNTER — Ambulatory Visit (HOSPITAL_COMMUNITY)
Admission: RE | Admit: 2022-02-10 | Discharge: 2022-02-10 | Disposition: A | Discharge status: Home / Self Care | Payer: BC Managed Care – PPO | Source: Ambulatory Visit | Attending: Adult Health | Admitting: Adult Health

## 2022-02-10 DIAGNOSIS — R928 Other abnormal and inconclusive findings on diagnostic imaging of breast: Secondary | ICD-10-CM | POA: Diagnosis present

## 2022-03-03 ENCOUNTER — Encounter (HOSPITAL_COMMUNITY): Payer: No Typology Code available for payment source

## 2022-03-03 ENCOUNTER — Other Ambulatory Visit (HOSPITAL_COMMUNITY): Payer: No Typology Code available for payment source

## 2022-03-16 ENCOUNTER — Ambulatory Visit: Payer: No Typology Code available for payment source | Admitting: Adult Health

## 2022-03-18 ENCOUNTER — Ambulatory Visit: Payer: BC Managed Care – PPO | Admitting: Adult Health

## 2023-01-19 ENCOUNTER — Encounter: Payer: Self-pay | Admitting: Adult Health

## 2023-01-19 ENCOUNTER — Ambulatory Visit: Payer: No Typology Code available for payment source | Admitting: Adult Health

## 2023-01-19 VITALS — BP 106/64 | HR 68 | Ht 64.0 in | Wt 208.0 lb

## 2023-01-19 DIAGNOSIS — Z131 Encounter for screening for diabetes mellitus: Secondary | ICD-10-CM

## 2023-01-19 DIAGNOSIS — Z01419 Encounter for gynecological examination (general) (routine) without abnormal findings: Secondary | ICD-10-CM

## 2023-01-19 DIAGNOSIS — K5900 Constipation, unspecified: Secondary | ICD-10-CM

## 2023-01-19 DIAGNOSIS — Z13220 Encounter for screening for lipoid disorders: Secondary | ICD-10-CM

## 2023-01-19 DIAGNOSIS — Z1211 Encounter for screening for malignant neoplasm of colon: Secondary | ICD-10-CM

## 2023-01-19 DIAGNOSIS — Z1231 Encounter for screening mammogram for malignant neoplasm of breast: Secondary | ICD-10-CM

## 2023-01-19 DIAGNOSIS — R0989 Other specified symptoms and signs involving the circulatory and respiratory systems: Secondary | ICD-10-CM

## 2023-01-19 DIAGNOSIS — N8110 Cystocele, unspecified: Secondary | ICD-10-CM

## 2023-01-19 DIAGNOSIS — R350 Frequency of micturition: Secondary | ICD-10-CM

## 2023-01-19 DIAGNOSIS — Z1329 Encounter for screening for other suspected endocrine disorder: Secondary | ICD-10-CM

## 2023-01-19 LAB — POCT URINALYSIS DIPSTICK
Blood, UA: NEGATIVE
Glucose, UA: NEGATIVE
Ketones, UA: NEGATIVE
Nitrite, UA: NEGATIVE
Protein, UA: NEGATIVE

## 2023-01-19 LAB — HEMOCCULT GUIAC POC 1CARD (OFFICE): Fecal Occult Blood, POC: NEGATIVE

## 2023-01-19 NOTE — Progress Notes (Signed)
Patient ID: Samantha [name], female   DOB: 01/19/1966, 57 y.o.   MRN: [id number] History of Present Illness: Samantha [name] is a 57 year old white female, married, sp hysterectomy in for a well woman gyn exam. Husband is waiting on kidney transplant, has been on dialysis for 6 years now.  PCP is Bi[facility], clinical at [facility].   Current Medications, Allergies, Past Medical History, Past Surgical History, Family History and Social History were reviewed in [employer] record.     Review of Systems: Patient denies any headaches, hearing loss, fatigue, blurred vision, shortness of breath, chest pain, abdominal pain, problems with  intercourse. No joint pain or mood swings.  Has constipation has started drinking mushroom coffee. Did see blood once in toilet Has urinary frequency  and some UI at times    Physical Exam:BP 106/64 (BP Location: Left Arm, Patient Position: Sitting, Cuff Size: Normal)   Pulse 68   Ht 5\' 4"  (1.626 m)   Wt 208 lb (94.3 kg)   BMI 35.70 kg/m  urine dipstick trace leuks  General:  Well developed, well nourished, no acute distress Skin:  Warm and dry,tan Neck:  Midline trachea, normal thyroid, good ROM, no lymphadenopathy Lungs; Clear to auscultation bilaterally Breast:  No dominant palpable mass, retraction, or nipple discharge Cardiovascular: Regular rate and rhythm Abdomen:  Soft, non tender, no hepatosplenomegaly Pelvic:  External genitalia is normal in appearance, no lesions.  The vagina is pale, mild cystocele. Urethra has no lesions or masses. The cervix and uterus are absent. No adnexal masses or tenderness noted.Bladder is non tender, no masses felt. Rectal: Good sphincter tone, no polyps, + hemorrhoids felt.  Hemoccult negative. Extremities/musculoskeletal:  No swelling or varicosities noted, no clubbing or cyanosis Psych:  No mood changes, alert and cooperative,seems happy AA is 0 Fall risk is low    01/19/2023    8:39 AM 12/19/2020    2:51 PM  12/05/2019   11:19 AM  Depression screen PHQ 2/9  Decreased Interest 0 0 0  Down, Depressed, Hopeless 0 0 0  PHQ - 2 Score 0 0 0  Altered sleeping 0 0 0  Tired, decreased energy 0 0 2  Change in appetite 0 0 0  Feeling bad or failure about yourself  0 0 0  Trouble concentrating 0 0 0  Moving slowly or fidgety/restless 0 0 0  Suicidal thoughts 0 0 0  PHQ-9 Score 0 0 2       01/19/2023    8:40 AM 12/19/2020    2:51 PM 12/05/2019   11:20 AM  GAD 7 : Generalized Anxiety Score  Nervous, Anxious, on Edge 0 0 0  Control/stop worrying 0 0 0  Worry too much - different things 0 0 0  Trouble relaxing 0 0 0  Restless 0 0 0  Easily annoyed or irritable 0 0 0  Afraid - awful might happen 0 0 0  Total GAD 7 Score 0 0 0      Upstream - 01/19/23 0843       Pregnancy Intention Screening   Does the patient want to become pregnant in the next year? N/A    Does the patient's partner want to become pregnant in the next year? N/A    Would the patient like to discuss contraceptive options today? N/A      Contraception Wrap Up   Current Method Female Sterilization   hyst   End Method Female Sterilization   hyst   Contraception Counseling Provided No  Examination chaperoned by Malachy Mood LPN  Impression and Plan: 1. Well woman exam with routine gynecological exam Physical in 1 year No pap due to sp hysterectomy Will check labs Colonoscopy per GI Stay active, she has a pool - CBC - Comprehensive metabolic panel - Lipid panel  2. Encounter for screening fecal occult blood testing Hemoccult was negative  - POCT occult blood stool  3. Cystocele, unspecified Mild cystocele   4. Screening cholesterol level - Lipid panel  5. Screening mammogram for breast cancer Mammogram is scheduled for 02/01/23 at 7 am at [facility]  - MM 3D SCREENING MAMMOGRAM BILATERAL BREAST; Future  6. Screening for diabetes mellitus - Hemoglobin A1c  7. Screening for thyroid disorder - TSH  + free T4  8. Choking sensation Feel like choking at times   9. Constipation, unspecified constipation type Try prunes or prune juice and increase water   10. Urinary frequency Trace leuks Increase water  - POCT Urinalysis Dipstick

## 2023-01-20 LAB — CBC
Hematocrit: 41.1 % (ref 34.0–46.6)
Hemoglobin: 13.2 g/dL (ref 11.1–15.9)
MCH: 28.6 pg (ref 26.6–33.0)
MCHC: 32.1 g/dL (ref 31.5–35.7)
MCV: 89 fL (ref 79–97)
Platelets: 307 10*3/uL (ref 150–450)
RBC: 4.62 x10E6/uL (ref 3.77–5.28)
RDW: 12.5 % (ref 11.7–15.4)
WBC: 4.7 10*3/uL (ref 3.4–10.8)

## 2023-01-20 LAB — LIPID PANEL
Chol/HDL Ratio: 4.4 ratio (ref 0.0–4.4)
Cholesterol, Total: 195 mg/dL (ref 100–199)
HDL: 44 mg/dL (ref >39)
LDL Chol Calc (NIH): 114 mg/dL — ABNORMAL HIGH (ref 0–99)
Triglycerides: 210 mg/dL — ABNORMAL HIGH (ref 0–149)
VLDL Cholesterol Cal: 37 mg/dL (ref 5–40)

## 2023-01-20 LAB — TSH+FREE T4
Free T4: 0.85 ng/dL (ref 0.82–1.77)
TSH: 2.38 u[IU]/mL (ref 0.450–4.500)

## 2023-01-20 LAB — COMPREHENSIVE METABOLIC PANEL
ALT: 15 IU/L (ref 0–32)
AST: 20 IU/L (ref 0–40)
Albumin: 4.4 g/dL (ref 3.8–4.9)
Alkaline Phosphatase: 80 IU/L (ref 44–121)
BUN/Creatinine Ratio: 10 (ref 9–23)
BUN: 10 mg/dL (ref 6–24)
Bilirubin Total: 0.6 mg/dL (ref 0.0–1.2)
CO2: 25 mmol/L (ref 20–29)
Calcium: 9.8 mg/dL (ref 8.7–10.2)
Chloride: 102 mmol/L (ref 96–106)
Creatinine, Ser: 1.01 mg/dL — ABNORMAL HIGH (ref 0.57–1.00)
Globulin, Total: 2.5 g/dL (ref 1.5–4.5)
Glucose: 92 mg/dL (ref 70–99)
Potassium: 4.5 mmol/L (ref 3.5–5.2)
Sodium: 137 mmol/L (ref 134–144)
Total Protein: 6.9 g/dL (ref 6.0–8.5)
eGFR: 66 mL/min/{1.73_m2} (ref >59)

## 2023-01-20 LAB — HEMOGLOBIN A1C
Est. average glucose Bld gHb Est-mCnc: 111 mg/dL
Hgb A1c MFr Bld: 5.5 % (ref 4.8–5.6)

## 2023-02-01 ENCOUNTER — Ambulatory Visit (HOSPITAL_COMMUNITY): Payer: No Typology Code available for payment source

## 2023-05-25 IMAGING — MG MM DIGITAL SCREENING BILAT W/ TOMO AND CAD
6 of 10 series · 6 of 30 positions shown · non-contrast
Comparison: Previous exam(s).

CLINICAL DATA: Screening.

EXAM:
DIGITAL SCREENING BILATERAL MAMMOGRAM WITH TOMOSYNTHESIS AND CAD
TECHNIQUE: Bilateral screening digital craniocaudal and mediolateral oblique
mammograms were obtained. Bilateral screening digital breast
tomosynthesis was performed. The images were evaluated with
computer-aided detection.

[L MLO synth-2D (1 of 2)]
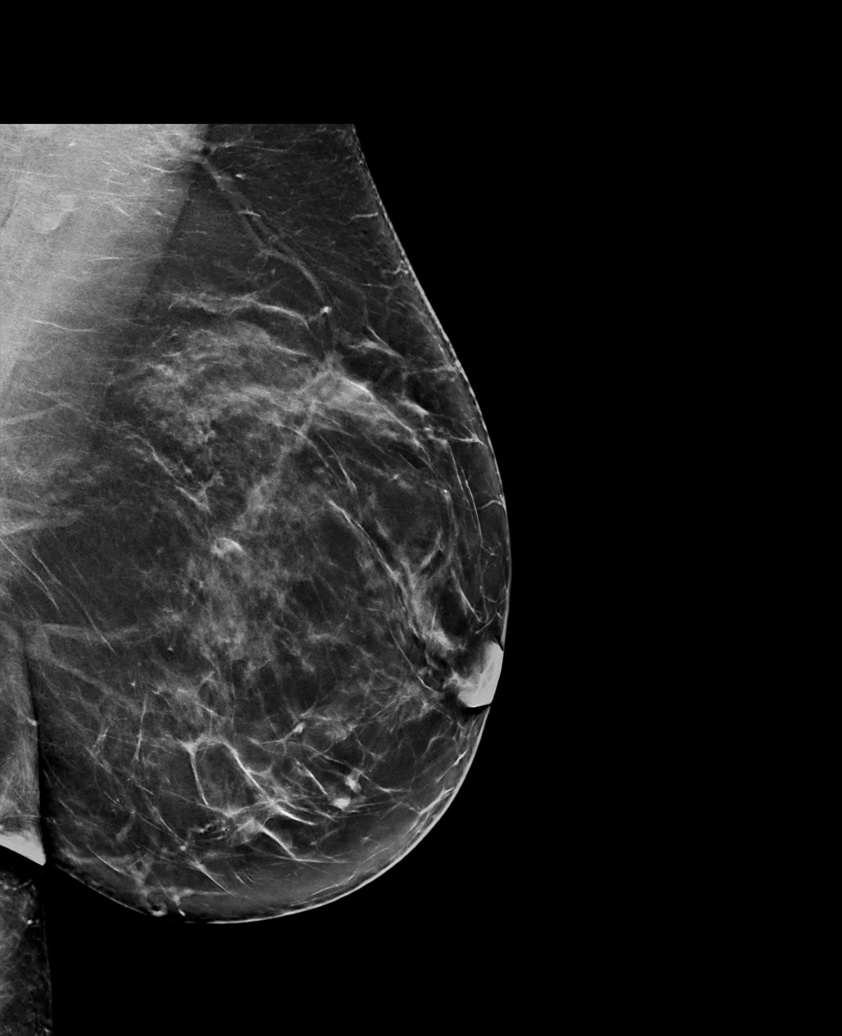

[L MLO synth-2D (2 of 2)]
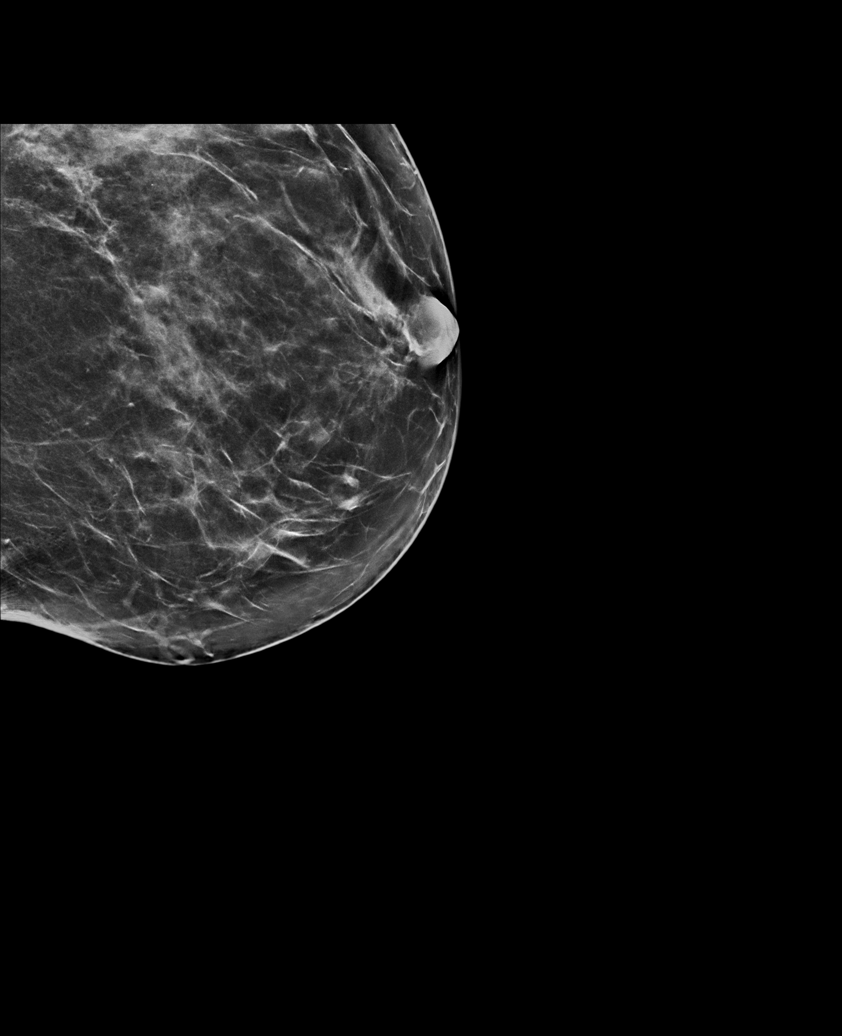

[L CC synth-2D]
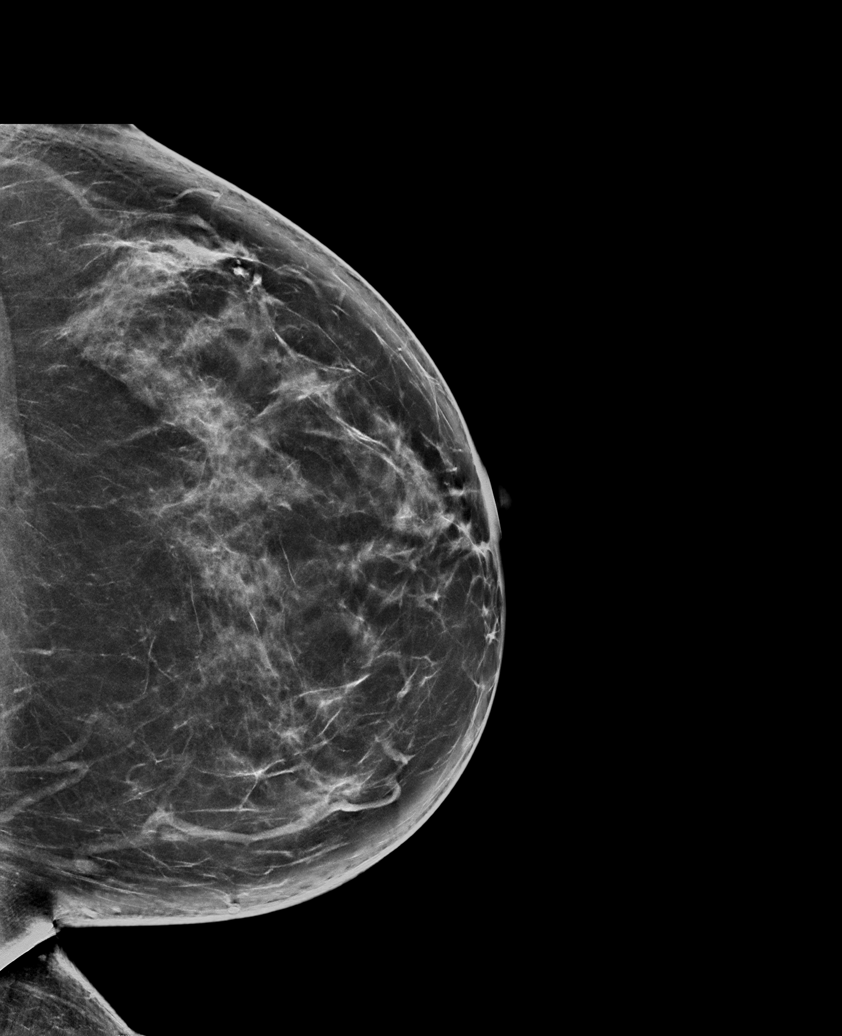

[R MLO synth-2D]
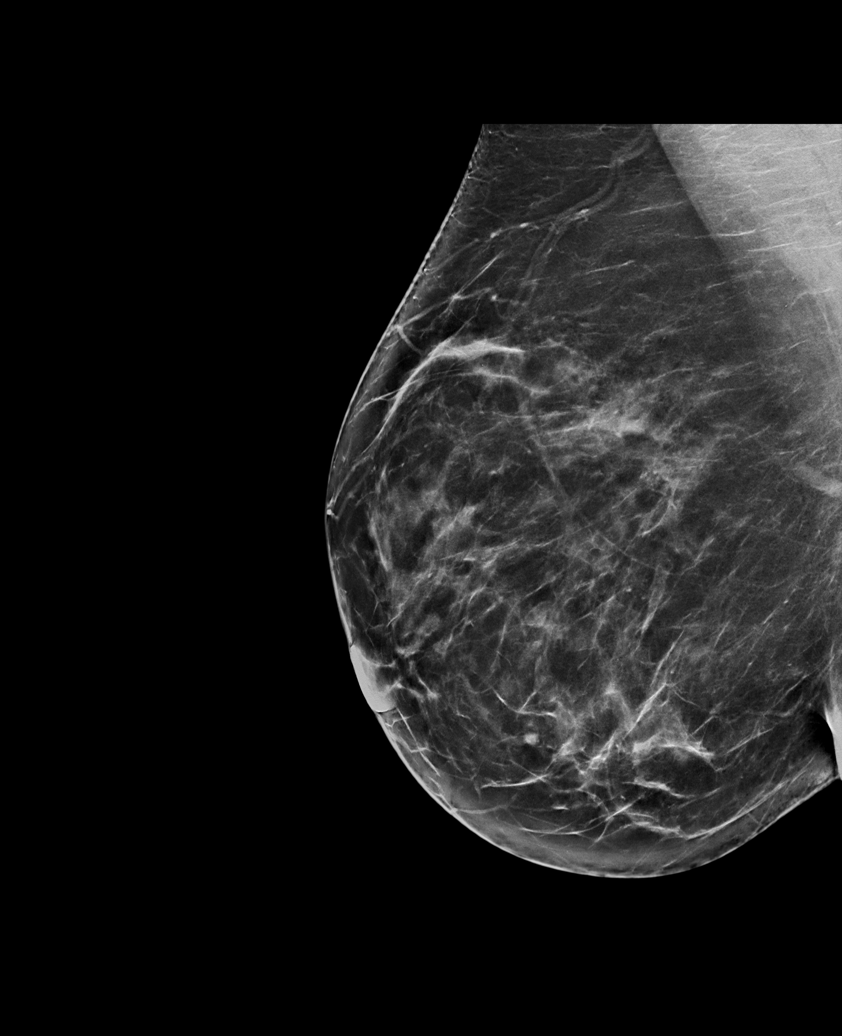

[R CC synth-2D]
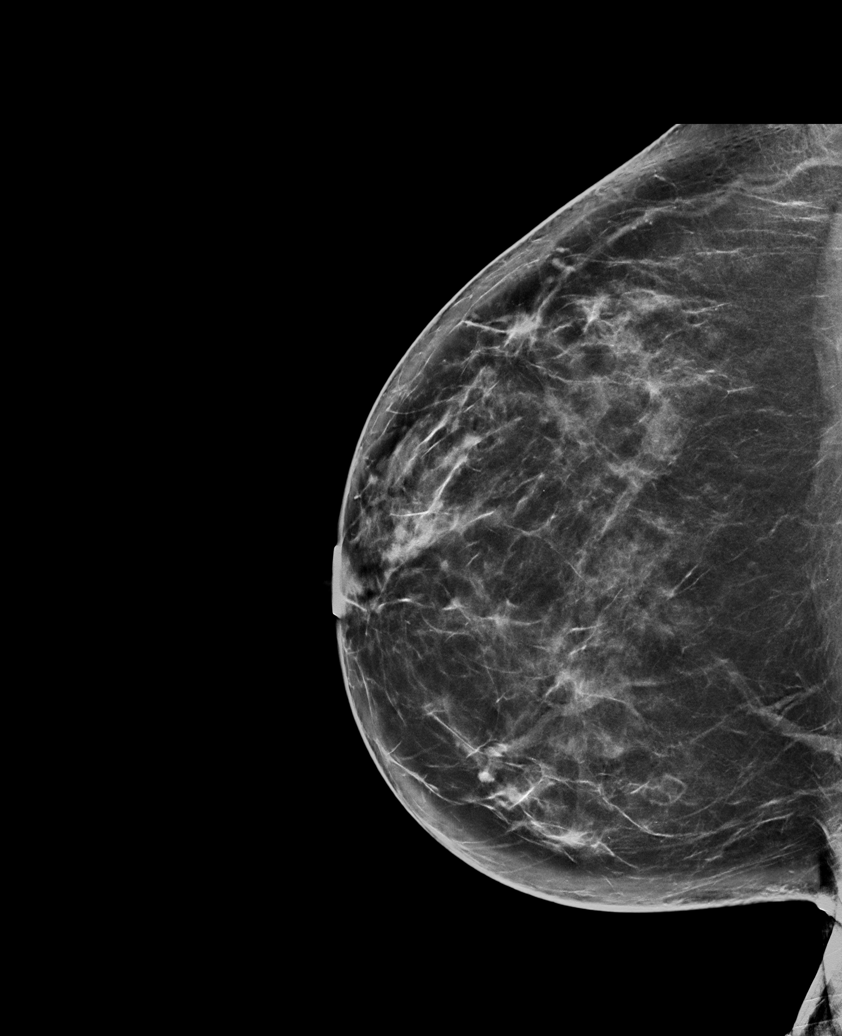

[L MLO tomo · tomo slice 45/88.0]
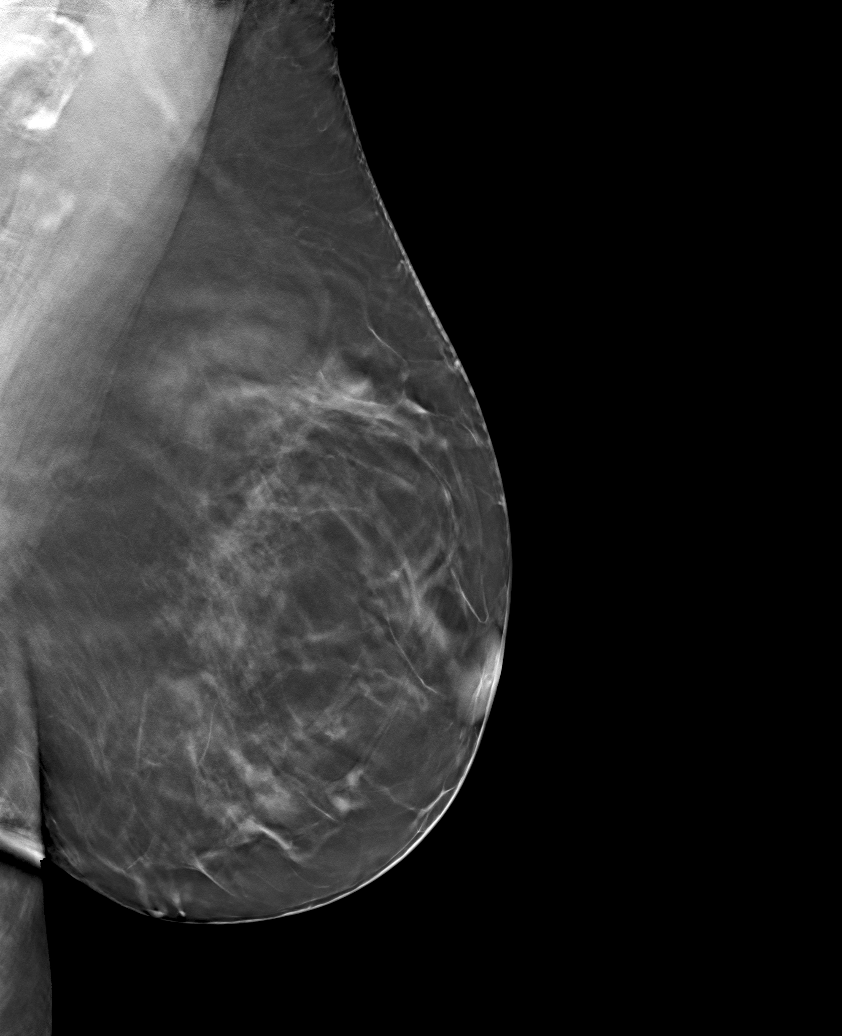

[6 of 30 positions shown; findings below may reference images not displayed]

ACR Breast Density Category c: The breast tissue is heterogeneously
dense, which may obscure small masses.
FINDINGS: There are no findings suspicious for malignancy.
IMPRESSION: No mammographic evidence of malignancy. A result letter of this
screening mammogram will be mailed directly to the patient.

RECOMMENDATION:
Screening mammogram in one year. (Code:Q3-W-BC3)

BI-RADS CATEGORY  1: Negative.

## 2024-03-14 ENCOUNTER — Ambulatory Visit: Admitting: Adult Health

## 2024-03-14 ENCOUNTER — Encounter: Payer: Self-pay | Admitting: Adult Health

## 2024-03-14 VITALS — BP 102/69 | HR 65 | Ht 63.0 in | Wt 191.0 lb

## 2024-03-14 DIAGNOSIS — Z90710 Acquired absence of both cervix and uterus: Secondary | ICD-10-CM

## 2024-03-14 DIAGNOSIS — Z01419 Encounter for gynecological examination (general) (routine) without abnormal findings: Secondary | ICD-10-CM | POA: Diagnosis not present

## 2024-03-14 DIAGNOSIS — Z1211 Encounter for screening for malignant neoplasm of colon: Secondary | ICD-10-CM | POA: Diagnosis not present

## 2024-03-14 DIAGNOSIS — N8110 Cystocele, unspecified: Secondary | ICD-10-CM

## 2024-03-14 DIAGNOSIS — Z1231 Encounter for screening mammogram for malignant neoplasm of breast: Secondary | ICD-10-CM

## 2024-03-14 DIAGNOSIS — L720 Epidermal cyst: Secondary | ICD-10-CM | POA: Insufficient documentation

## 2024-03-14 LAB — HEMOCCULT GUIAC POC 1CARD (OFFICE): Fecal Occult Blood, POC: NEGATIVE

## 2024-03-14 NOTE — Progress Notes (Signed)
 Patient ID: Samantha [name], female   DOB: Mar 12, 1966, 58 y.o.   MRN: [id number] History of Present Illness: Samantha [name] is a 58 year old white female, married, sp hysterectomy in for a well woman gyn exam. 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  PCP is [facility] in [place].  Current Medications, Allergies, Past Medical History, Past Surgical History, Family History and Social History were reviewed in [employer] record.     Review of Systems: Patient denies any headaches, hearing loss, fatigue, blurred vision, shortness of breath, chest pain, abdominal pain, problems with bowel movements, urination, or intercourse(not active). No joint pain or mood swings.  Has spot left groin, that feels firm   Physical Exam:BP 102/69 (BP Location: Left Arm, Patient Position: Sitting, Cuff Size: Normal)   Pulse 65   Ht 5' 3 (1.6 m)   Wt 191 lb (86.6 kg)   BMI 33.83 kg/m   General:  Well developed, well nourished, no acute distress Skin:  Warm and dry Neck:  Midline trachea, normal thyroid , good ROM, no lymphadenopathy Lungs; Clear to auscultation bilaterally Breast:  No dominant palpable mass, retraction, or nipple discharge Cardiovascular: Regular rate and rhythm Abdomen:  Soft, non tender, no hepatosplenomegaly Pelvic:  External genitalia is normal in appearance, feels like epidermal cyst left groin.  The vagina is normal in appearance, +mild cystocele. Urethra has no lesions or masses. The cervix and uterus are absent.  No adnexal masses or tenderness noted.Bladder is non tender, no masses felt. Rectal: Good sphincter tone, no polyps, or hemorrhoids felt.  Hemoccult negative. Extremities/musculoskeletal:  No swelling or varicosities noted, no clubbing or cyanosis Psych:  No mood changes, alert and cooperative,seems happy AA is 0 Fall risk is  low    03/14/2024    9:39 AM 01/19/2023    8:39 AM 12/19/2020    2:51 PM  Depression  screen PHQ 2/9  Decreased Interest 0 0 0  Down, Depressed, Hopeless 0 0 0  PHQ - 2 Score 0 0 0  Altered sleeping 0 0 0  Tired, decreased energy 0 0 0  Change in appetite 0 0 0  Feeling bad or failure about yourself  0 0 0  Trouble concentrating 0 0 0  Moving slowly or fidgety/restless 0 0 0  Suicidal thoughts 0 0 0  PHQ-9 Score 0 0 0       03/14/2024    9:40 AM 01/19/2023    8:40 AM 12/19/2020    2:51 PM 12/05/2019   11:20 AM  GAD 7 : Generalized Anxiety Score  Nervous, Anxious, on Edge 1 0 0 0  Control/stop worrying 0 0 0 0  Worry too much - different things 0 0 0 0  Trouble relaxing 0 0 0 0  Restless 0 0 0 0  Easily annoyed or irritable 0 0 0 0  Afraid - awful might happen 0 0 0 0  Total GAD 7 Score 1 0 0 0    Upstream - 03/14/24 0945       Pregnancy Intention Screening   Does the patient want to become pregnant in the next year? N/A    Does the patient's partner want to become pregnant in the next year? N/A    Would the patient like to discuss contraceptive options today? N/A      Contraception Wrap Up   Current Method Female Sterilization   hyst   End Method Female Sterilization   hyst  Contraception Counseling Provided No         Examination chaperoned by Clarita Salt LPN    Impression and plan: 1. Well woman exam with routine gynecological exam (Primary) Physical in 1 year Labs with PCP Colonoscopy per GI   2. Encounter for screening fecal occult blood testing Hemoccult was negative  - POCT occult blood stool  3. Screening mammogram for breast cancer Mammogram scheduled [facility] 03/29/24 at 10:15 am  - MM 3D SCREENING MAMMOGRAM BILATERAL BREAST; Future  4. Cystocele, unspecified  5. S/P hysterectomy  6. Epidermal cyst

## 2024-03-29 ENCOUNTER — Ambulatory Visit (HOSPITAL_COMMUNITY)
